# Patient Record
Sex: Female | Born: 1955
Health system: Southern US, Community
[De-identification: ages and names within clinical notes are randomized; demographics above are authoritative.]

## PROBLEM LIST (undated history)

## (undated) DIAGNOSIS — Z860100 Personal history of colon polyps, unspecified: Secondary | ICD-10-CM

## (undated) DIAGNOSIS — Z8489 Family history of other specified conditions: Secondary | ICD-10-CM

## (undated) DIAGNOSIS — E7800 Pure hypercholesterolemia, unspecified: Secondary | ICD-10-CM

## (undated) DIAGNOSIS — I10 Essential (primary) hypertension: Secondary | ICD-10-CM

## (undated) DIAGNOSIS — S43429A Sprain of unspecified rotator cuff capsule, initial encounter: Secondary | ICD-10-CM

## (undated) DIAGNOSIS — M1990 Unspecified osteoarthritis, unspecified site: Secondary | ICD-10-CM

## (undated) DIAGNOSIS — C801 Malignant (primary) neoplasm, unspecified: Secondary | ICD-10-CM

## (undated) DIAGNOSIS — D649 Anemia, unspecified: Secondary | ICD-10-CM

## (undated) DIAGNOSIS — Z95810 Presence of automatic (implantable) cardiac defibrillator: Secondary | ICD-10-CM

## (undated) DIAGNOSIS — I5181 Takotsubo syndrome: Secondary | ICD-10-CM

## (undated) DIAGNOSIS — I447 Left bundle-branch block, unspecified: Secondary | ICD-10-CM

## (undated) DIAGNOSIS — R000 Tachycardia, unspecified: Secondary | ICD-10-CM

## (undated) DIAGNOSIS — I5022 Chronic systolic (congestive) heart failure: Secondary | ICD-10-CM

## (undated) DIAGNOSIS — J4550 Severe persistent asthma, uncomplicated: Secondary | ICD-10-CM

## (undated) HISTORY — PX: CHOLECYSTECTOMY: SHX55

## (undated) HISTORY — DX: Takotsubo syndrome: I51.81

## (undated) HISTORY — PX: ABDOMINAL HYSTERECTOMY: SHX81

## (undated) HISTORY — DX: Tachycardia, unspecified: R00.0

## (undated) HISTORY — DX: Personal history of colonic polyps: Z86.010

## (undated) HISTORY — DX: Chronic systolic (congestive) heart failure: I50.22

## (undated) HISTORY — PX: OOPHORECTOMY: SHX86

## (undated) HISTORY — DX: Severe persistent asthma, uncomplicated: J45.50

## (undated) HISTORY — DX: Left bundle-branch block, unspecified: I44.7

## (undated) HISTORY — DX: Sprain of unspecified rotator cuff capsule, initial encounter: S43.429A

## (undated) HISTORY — PX: COLONOSCOPY: SHX174

## (undated) HISTORY — DX: Personal history of colon polyps, unspecified: Z86.0100

---
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
## 1986-10-26 DIAGNOSIS — C801 Malignant (primary) neoplasm, unspecified: Secondary | ICD-10-CM

## 1986-10-26 HISTORY — DX: Malignant (primary) neoplasm, unspecified: C80.1

## 2006-08-22 ENCOUNTER — Emergency Department: Payer: Self-pay | Admitting: Emergency Medicine

## 2013-07-24 ENCOUNTER — Ambulatory Visit: Payer: Self-pay | Admitting: Internal Medicine

## 2013-10-09 ENCOUNTER — Ambulatory Visit: Payer: Self-pay | Admitting: Gastroenterology

## 2013-10-10 ENCOUNTER — Ambulatory Visit: Payer: Self-pay | Admitting: Gastroenterology

## 2013-10-10 LAB — CBC WITH DIFFERENTIAL/PLATELET
Eosinophil #: 0.4 10*3/uL (ref 0.0–0.7)
HCT: 35.5 % (ref 35.0–47.0)
HGB: 12.6 g/dL (ref 12.0–16.0)
Lymphocyte #: 1.9 10*3/uL (ref 1.0–3.6)
Lymphocyte %: 27.7 %
MCH: 34.2 pg — ABNORMAL HIGH (ref 26.0–34.0)
MCHC: 35.5 g/dL (ref 32.0–36.0)
MCV: 96 fL (ref 80–100)
Monocyte #: 0.4 x10 3/mm (ref 0.2–0.9)
Neutrophil #: 4.1 10*3/uL (ref 1.4–6.5)
Platelet: 155 10*3/uL (ref 150–440)
RBC: 3.69 10*6/uL — ABNORMAL LOW (ref 3.80–5.20)
RDW: 13.4 % (ref 11.5–14.5)
WBC: 6.8 10*3/uL (ref 3.6–11.0)

## 2013-10-10 LAB — BASIC METABOLIC PANEL
Anion Gap: 2 — ABNORMAL LOW (ref 7–16)
BUN: 10 mg/dL (ref 7–18)
Calcium, Total: 10.8 mg/dL — ABNORMAL HIGH (ref 8.5–10.1)
Chloride: 106 mmol/L (ref 98–107)
Creatinine: 0.79 mg/dL (ref 0.60–1.30)
EGFR (African American): >60
EGFR (Non-African Amer.): >60
Glucose: 103 mg/dL — ABNORMAL HIGH (ref 65–99)
Sodium: 137 mmol/L (ref 136–145)

## 2015-02-04 ENCOUNTER — Emergency Department
Admit: 2015-02-04 | Disposition: A | Discharge status: Home / Self Care | Payer: Self-pay | Admitting: Emergency Medicine

## 2015-02-08 DIAGNOSIS — I10 Essential (primary) hypertension: Secondary | ICD-10-CM | POA: Insufficient documentation

## 2015-02-21 ENCOUNTER — Ambulatory Visit
Admit: 2015-02-21 | Disposition: A | Discharge status: Home / Self Care | Payer: Self-pay | Attending: Internal Medicine | Admitting: Internal Medicine

## 2015-03-14 DIAGNOSIS — E7800 Pure hypercholesterolemia, unspecified: Secondary | ICD-10-CM | POA: Insufficient documentation

## 2015-04-01 ENCOUNTER — Other Ambulatory Visit: Payer: Self-pay | Admitting: Orthopedic Surgery

## 2015-04-01 DIAGNOSIS — S43421D Sprain of right rotator cuff capsule, subsequent encounter: Secondary | ICD-10-CM

## 2015-04-01 DIAGNOSIS — S40011D Contusion of right shoulder, subsequent encounter: Secondary | ICD-10-CM

## 2015-04-09 ENCOUNTER — Ambulatory Visit
Admission: RE | Admit: 2015-04-09 | Discharge: 2015-04-09 | Disposition: A | Discharge status: Home / Self Care | Payer: 59 | Source: Ambulatory Visit | Attending: Orthopedic Surgery | Admitting: Orthopedic Surgery

## 2015-04-09 DIAGNOSIS — S43421D Sprain of right rotator cuff capsule, subsequent encounter: Secondary | ICD-10-CM | POA: Diagnosis not present

## 2015-04-09 DIAGNOSIS — S40011D Contusion of right shoulder, subsequent encounter: Secondary | ICD-10-CM | POA: Diagnosis not present

## 2015-05-14 DIAGNOSIS — S43421A Sprain of right rotator cuff capsule, initial encounter: Secondary | ICD-10-CM | POA: Insufficient documentation

## 2015-06-11 DIAGNOSIS — M75121 Complete rotator cuff tear or rupture of right shoulder, not specified as traumatic: Secondary | ICD-10-CM | POA: Insufficient documentation

## 2015-06-20 ENCOUNTER — Encounter: Payer: Self-pay | Admitting: *Deleted

## 2015-06-21 ENCOUNTER — Encounter: Payer: Self-pay | Admitting: Anesthesiology

## 2015-06-28 ENCOUNTER — Ambulatory Visit
Admission: RE | Admit: 2015-06-28 | Discharge: 2015-06-28 | Disposition: A | Discharge status: Home / Self Care | Payer: 59 | Source: Ambulatory Visit | Attending: Unknown Physician Specialty | Admitting: Unknown Physician Specialty

## 2015-06-28 ENCOUNTER — Ambulatory Visit: Payer: 59 | Admitting: Anesthesiology

## 2015-06-28 ENCOUNTER — Encounter
Admission: RE | Disposition: A | Discharge status: Home / Self Care | Payer: Self-pay | Source: Ambulatory Visit | Attending: Unknown Physician Specialty

## 2015-06-28 ENCOUNTER — Encounter: Payer: Self-pay | Admitting: *Deleted

## 2015-06-28 DIAGNOSIS — Z833 Family history of diabetes mellitus: Secondary | ICD-10-CM | POA: Insufficient documentation

## 2015-06-28 DIAGNOSIS — E785 Hyperlipidemia, unspecified: Secondary | ICD-10-CM | POA: Insufficient documentation

## 2015-06-28 DIAGNOSIS — Z8249 Family history of ischemic heart disease and other diseases of the circulatory system: Secondary | ICD-10-CM | POA: Diagnosis not present

## 2015-06-28 DIAGNOSIS — M1990 Unspecified osteoarthritis, unspecified site: Secondary | ICD-10-CM | POA: Insufficient documentation

## 2015-06-28 DIAGNOSIS — Z9049 Acquired absence of other specified parts of digestive tract: Secondary | ICD-10-CM | POA: Insufficient documentation

## 2015-06-28 DIAGNOSIS — I10 Essential (primary) hypertension: Secondary | ICD-10-CM | POA: Diagnosis not present

## 2015-06-28 DIAGNOSIS — M75121 Complete rotator cuff tear or rupture of right shoulder, not specified as traumatic: Secondary | ICD-10-CM | POA: Diagnosis not present

## 2015-06-28 DIAGNOSIS — F17210 Nicotine dependence, cigarettes, uncomplicated: Secondary | ICD-10-CM | POA: Diagnosis not present

## 2015-06-28 DIAGNOSIS — M75101 Unspecified rotator cuff tear or rupture of right shoulder, not specified as traumatic: Secondary | ICD-10-CM | POA: Diagnosis present

## 2015-06-28 DIAGNOSIS — Z86010 Personal history of colonic polyps: Secondary | ICD-10-CM | POA: Insufficient documentation

## 2015-06-28 HISTORY — PX: SHOULDER ARTHROSCOPY WITH OPEN ROTATOR CUFF REPAIR: SHX6092

## 2015-06-28 HISTORY — DX: Pure hypercholesterolemia, unspecified: E78.00

## 2015-06-28 HISTORY — DX: Unspecified osteoarthritis, unspecified site: M19.90

## 2015-06-28 HISTORY — DX: Essential (primary) hypertension: I10

## 2015-06-28 SURGERY — ARTHROSCOPY, SHOULDER WITH REPAIR, ROTATOR CUFF, OPEN
Anesthesia: General | Laterality: Right | Wound class: Clean

## 2015-06-28 MED ORDER — CEFAZOLIN SODIUM-DEXTROSE 2-3 GM-% IV SOLR
INTRAVENOUS | Status: DC | PRN
  Administered 2015-06-28: 2 g via INTRAVENOUS

## 2015-06-28 MED ORDER — ROPIVACAINE HCL 5 MG/ML IJ SOLN
INTRAMUSCULAR | Status: DC | PRN
  Administered 2015-06-28: 30 mL via EPIDURAL

## 2015-06-28 MED ORDER — FENTANYL CITRATE (PF) 100 MCG/2ML IJ SOLN
50.0000 ug | Freq: Once | INTRAMUSCULAR | Status: AC
Start: 2015-06-28 — End: 2015-06-28
  Administered 2015-06-28: 50 ug via INTRAVENOUS

## 2015-06-28 MED ORDER — PROMETHAZINE HCL 25 MG/ML IJ SOLN: 6.2500 mg | INTRAMUSCULAR | Status: DC | PRN

## 2015-06-28 MED ORDER — ONDANSETRON HCL 4 MG/2ML IJ SOLN
INTRAMUSCULAR | Status: DC | PRN
  Administered 2015-06-28: 4 mg via INTRAVENOUS

## 2015-06-28 MED ORDER — LACTATED RINGERS IV SOLN
INTRAVENOUS | Status: DC
  Administered 2015-06-28: 11:00:00 via INTRAVENOUS

## 2015-06-28 MED ORDER — FENTANYL CITRATE (PF) 100 MCG/2ML IJ SOLN: 25.0000 ug | INTRAMUSCULAR | Status: DC | PRN

## 2015-06-28 MED ORDER — PROPOFOL 10 MG/ML IV BOLUS
INTRAVENOUS | Status: DC | PRN
  Administered 2015-06-28: 150 mg via INTRAVENOUS

## 2015-06-28 MED ORDER — LACTATED RINGERS IV SOLN
INTRAVENOUS | Status: DC | PRN
  Administered 2015-06-28: 8000 mL

## 2015-06-28 MED ORDER — MIDAZOLAM HCL 2 MG/2ML IJ SOLN
1.0000 mg | Freq: Once | INTRAMUSCULAR | Status: AC
  Administered 2015-06-28: 1 mg via INTRAVENOUS

## 2015-06-28 MED ORDER — LIDOCAINE HCL (CARDIAC) 20 MG/ML IV SOLN
INTRAVENOUS | Status: DC | PRN
  Administered 2015-06-28: 40 mg via INTRATRACHEAL

## 2015-06-28 MED ORDER — DEXAMETHASONE SODIUM PHOSPHATE 4 MG/ML IJ SOLN
INTRAMUSCULAR | Status: DC | PRN
Start: 2015-06-28 — End: 2015-06-28
  Administered 2015-06-28: 8 mg via INTRAVENOUS

## 2015-06-28 SURGICAL SUPPLY — 82 items
ADAPTER IRRIG TUBE 2 SPIKE SOL (ADAPTER) ×3 IMPLANT
ANCHOR ALL-SUT Q-FIX 2.8 (Anchor) ×6 IMPLANT
ANCHOR SUT 5.5 SPEEDSCREW (Screw) ×6 IMPLANT
ARTHROWAND PARAGON T2 (SURGICAL WAND)
BLADE SHAVER 4.5X7 STR FR (MISCELLANEOUS) ×3 IMPLANT
BLADE SURG 15 STRL LF DISP TIS (BLADE) IMPLANT
BLADE SURG 15 STRL SS (BLADE)
BUR ABRADER 5.5 BLK (MISCELLANEOUS) ×1 IMPLANT
BUR BR 5.5 12 FLUTE (BURR) IMPLANT
BUR BR 5.5 WIDE MOUTH (BURR) ×3 IMPLANT
BUR HOODED 3.0 ABRASION (BURR) IMPLANT
BUR RADIUS 4.0X18.5 (BURR) IMPLANT
BUR RADIUS 5.5 (BURR) IMPLANT
BURR ABRADER 5.5 BLK (MISCELLANEOUS) ×3
CANN 8.5MMX72MM THREADED (CANNULA) ×1
CANNULA 8.5X75 THRED (CANNULA) IMPLANT
CANNULA SHAVER 8MMX76MM (CANNULA) IMPLANT
CANNULA THRD 8.5X72 DISP (CANNULA) ×2 IMPLANT
CAP LOCK ULTRA CANNULA (MISCELLANEOUS) IMPLANT
COVER LIGHT HANDLE FLEXIBLE (MISCELLANEOUS) ×3 IMPLANT
CUTTER CANN W/HOLE 4.5 (CUTTER) IMPLANT
CUTTER SLOTTED WHISKER 4.0 (BURR) IMPLANT
DRAPE STERI 35X30 U-POUCH (DRAPES) ×3 IMPLANT
DURAPREP 26ML APPLICATOR (WOUND CARE) ×3 IMPLANT
GAUZE SPONGE 4X4 12PLY STRL (GAUZE/BANDAGES/DRESSINGS) ×3 IMPLANT
GLOVE BIO SURGEON STRL SZ7.5 (GLOVE) ×3 IMPLANT
GLOVE BIO SURGEON STRL SZ8 (GLOVE) ×3 IMPLANT
GLOVE INDICATOR 8.0 STRL GRN (GLOVE) ×3 IMPLANT
GOWN STRL REIN 2XL LVL4 (GOWN DISPOSABLE) ×3 IMPLANT
GOWN STRL REUS W/ TWL LRG LVL3 (GOWN DISPOSABLE) ×1 IMPLANT
GOWN STRL REUS W/TWL LRG LVL3 (GOWN DISPOSABLE) ×2
IV LACTATED RINGER IRRG 3000ML (IV SOLUTION) ×2
IV LR IRRIG 3000ML ARTHROMATIC (IV SOLUTION) ×1 IMPLANT
KIT SHOULDER TRACTION (DRAPES) ×3 IMPLANT
KIT SUTURE 2.8 Q-FIX DISP (MISCELLANEOUS) ×3 IMPLANT
MANIFOLD 4PT FOR NEPTUNE1 (MISCELLANEOUS) ×3 IMPLANT
NEEDLE 18GX1X1/2 (RX/OR ONLY) (NEEDLE) IMPLANT
NEEDLE MAYO CATGUT SZ 1.5 (NEEDLE)
NEEDLE MAYO CATGUT SZ 2 (NEEDLE) IMPLANT
NEEDLE SPNL 18GX3.5 QUINCKE PK (NEEDLE) IMPLANT
PACK ARTHROSCOPY SHOULDER (MISCELLANEOUS) ×3 IMPLANT
PAD GROUND ADULT SPLIT (MISCELLANEOUS) ×3 IMPLANT
PASSER SUT CAPTURE FIRST (SUTURE) ×3 IMPLANT
SET TUBE SUCT SHAVER OUTFL 24K (TUBING) ×3 IMPLANT
SLING ULTRA II M (MISCELLANEOUS) ×3 IMPLANT
SOL PREP PVP 2OZ (MISCELLANEOUS) ×3
SOLUTION PREP PVP 2OZ (MISCELLANEOUS) ×1 IMPLANT
STAPLER SKIN PROX 35W (STAPLE) IMPLANT
SUT ETHIBOND NAB CT1 #1 30IN (SUTURE) ×3 IMPLANT
SUT ETHILON 3-0 FS-10 30 BLK (SUTURE) ×3
SUT MAGNUM WIRE 2 (SUTURE) IMPLANT
SUT PDS AB 1 CT1 27 (SUTURE) IMPLANT
SUT PDSII 0 (SUTURE) IMPLANT
SUT PERFECTPASSER WHITE CART (SUTURE) IMPLANT
SUT PROLENE 2 0 CT2 30 (SUTURE) IMPLANT
SUT SMART STITCH CARTRIDGE (SUTURE) IMPLANT
SUT TICRON 2-0 30IN 311381 (SUTURE) IMPLANT
SUT VIC AB 0 CT1 36 (SUTURE) ×3 IMPLANT
SUT VIC AB 0 CT2 27 (SUTURE) IMPLANT
SUT VIC AB 2-0 CT1 27 (SUTURE)
SUT VIC AB 2-0 CT1 TAPERPNT 27 (SUTURE) IMPLANT
SUT VIC AB 2-0 CT2 27 (SUTURE) IMPLANT
SUT VIC AB 2-0 SH 27 (SUTURE) ×2
SUT VIC AB 2-0 SH 27XBRD (SUTURE) ×1 IMPLANT
SUT VIC AB 3-0 SH 27 (SUTURE) ×2
SUT VIC AB 3-0 SH 27X BRD (SUTURE) ×1 IMPLANT
SUTURE EHLN 3-0 FS-10 30 BLK (SUTURE) ×1 IMPLANT
SUTURE MAGNUM WIRE 2X48 BLK (SUTURE) IMPLANT
SYRINGE 10CC LL (SYRINGE) IMPLANT
TAPE MICROFOAM 4IN (TAPE) ×3 IMPLANT
TUBING ARTHRO INFLOW-ONLY STRL (TUBING) ×3 IMPLANT
WAND 30 DEG SABER W/CORD (SURGICAL WAND) IMPLANT
WAND ARTHRO PARAGON T2 (SURGICAL WAND) IMPLANT
WAND COVAC 50 IFS (MISCELLANEOUS) IMPLANT
WAND COVATOR 20 (MISCELLANEOUS) IMPLANT
WAND HAND CNTRL MULTIVAC 50 (MISCELLANEOUS) IMPLANT
WAND HAND CNTRL MULTIVAC 90 (MISCELLANEOUS) ×3 IMPLANT
WAND MEGAVAC 90 (MISCELLANEOUS) IMPLANT
WAND TENDON TOPAZ 0 ANGL (MISCELLANEOUS) IMPLANT
WAND TOPAZ EPF  WAS Q (MISCELLANEOUS)
WAND TOPAZ EPF WAS Q (MISCELLANEOUS) IMPLANT
WIRE MAGNUM (SUTURE) IMPLANT

## 2015-06-28 NOTE — Progress Notes (Signed)
Assisted Waldon Merl ANMD with right, ultrasound guided, interscalene  block. Side rails up, monitors on throughout procedure. See vital signs in flow sheet. Tolerated Procedure well.

## 2015-06-28 NOTE — Discharge Instructions (Signed)
General Anesthesia, Care After Refer to this sheet in the next few weeks. These instructions provide you with information on caring for yourself after your procedure. Your health care provider may also give you more specific instructions. Your treatment has been planned according to current medical practices, but problems sometimes occur. Call your health care provider if you have any problems or questions after your procedure. WHAT TO EXPECT AFTER THE PROCEDURE After the procedure, it is typical to experience:  Sleepiness.  Nausea and vomiting. HOME CARE INSTRUCTIONS  For the first 24 hours after general anesthesia:  Have a responsible person with you.  Do not drive a car. If you are alone, do not take public transportation.  Do not drink alcohol.  Do not take medicine that has not been prescribed by your health care provider.  Do not sign important papers or make important decisions.  You may resume a normal diet and activities as directed by your health care provider.  Change bandages (dressings) as directed.  If you have questions or problems that seem related to general anesthesia, call the hospital and ask for the anesthetist or anesthesiologist on call. SEEK MEDICAL CARE IF:  You have nausea and vomiting that continue the day after anesthesia.  You develop a rash. SEEK IMMEDIATE MEDICAL CARE IF:   You have difficulty breathing.  You have chest pain.  You have any allergic problems. Document Released: 01/18/2001 Document Revised: 10/17/2013 Document Reviewed: 04/27/2013 [facility] Patient Information 2015 [address], [place]. This information is not intended to replace advice given to you by your health care provider. Make sure you discuss any questions you have with your health care provider. Use shoulder immobilizer at all times  Keep dressing dry  Leave dressing in place until first postoperative visit  Return to the clinic about 1 week post surgery  Take 81 mg  aspirin or Bufferin tablet twice a day for 2 weeks post surgery  Can sleep with multiple pillows behind the back or in a recliner  Use TENS unit if prescribed  Take pain medication prior to going to sleep the evening of your surgery  Ice pack prn  Activity as tolerated

## 2015-06-28 NOTE — Anesthesia Preprocedure Evaluation (Signed)
Anesthesia Evaluation  Patient identified by MRN, date of birth, ID band Patient awake    Reviewed: Allergy & Precautions, NPO status , Patient's Chart, lab work & pertinent test results  Airway Mallampati: II  TM Distance: >3 FB Neck ROM: Full    Dental no notable dental hx.    Pulmonary neg pulmonary ROS, Current Smoker,  breath sounds clear to auscultation  Pulmonary exam normal       Cardiovascular hypertension, negative cardio ROS Normal cardiovascular examRhythm:Regular Rate:Normal     Neuro/Psych negative neurological ROS  negative psych ROS   GI/Hepatic negative GI ROS, Neg liver ROS,   Endo/Other  negative endocrine ROS  Renal/GU negative Renal ROS  negative genitourinary   Musculoskeletal negative musculoskeletal ROS (+)   Abdominal   Peds negative pediatric ROS (+)  Hematology negative hematology ROS (+)   Anesthesia Other Findings   Reproductive/Obstetrics negative OB ROS                             Anesthesia Physical Anesthesia Plan  ASA: II  Anesthesia Plan: General   Post-op Pain Management:    Induction: Intravenous  Airway Management Planned:   Additional Equipment:   Intra-op Plan:   Post-operative Plan: Extubation in OR  Informed Consent: I have reviewed the patients History and Physical, chart, labs and discussed the procedure including the risks, benefits and alternatives for the proposed anesthesia with the patient or authorized representative who has indicated his/her understanding and acceptance.   Dental advisory given  Plan Discussed with: CRNA  Anesthesia Plan Comments:         Anesthesia Quick Evaluation

## 2015-06-28 NOTE — Anesthesia Postprocedure Evaluation (Signed)
  Anesthesia Post-op Note  Patient: Raven Lambert  Procedure(s) Performed: Procedure(s): SHOULDER ARTHROSCOPY WITH  OPEN ROTATOR CUFF REPAIR, Biceps tendon release and subacromial decompression. (Right)  Anesthesia type:General  Patient location: PACU  Post pain: Pain level controlled  Post assessment: Post-op Vital signs reviewed, Patient's Cardiovascular Status Stable, Respiratory Function Stable, Patent Airway and No signs of Nausea or vomiting  Post vital signs: Reviewed and stable  Last Vitals:  Filed Vitals:   06/28/15 1415  BP: 120/60  Pulse: 98  Temp:   Resp: 22    Level of consciousness: awake, alert  and patient cooperative  Complications: No apparent anesthesia complications

## 2015-06-28 NOTE — Anesthesia Procedure Notes (Addendum)
Anesthesia Regional Block:  Interscalene brachial plexus block  Pre-Anesthetic Checklist: ,, timeout performed, Correct Patient, Correct Site, Correct Laterality, Correct Procedure, Correct Position, site marked, Risks and benefits discussed,  Surgical consent,  Pre-op evaluation,  At surgeon's request and post-op pain management  Laterality: Right  Prep: chloraprep       Needles:  Injection technique: Single-shot  Needle Type: Stimiplex     Needle Length: 10cm 10 cm Needle Gauge: 21 and 21 G  Needle insertion depth: 3 cm   Additional Needles:  Procedures: ultrasound guided (picture in chart) and nerve stimulator Interscalene brachial plexus block  Nerve Stimulator or Paresthesia:  Response: 0.5 mA,   Additional Responses:   Narrative:  Start time: 06/28/2015 10:50 AM End time: 06/28/2015 10:57 AM Injection made incrementally with aspirations every 5 mL.  Performed by: Personally  Anesthesiologist: Waldon Merl C  Additional Notes: Functioning IV was confirmed and monitors applied. Ultrasound guidance: relevant anatomy identified, needle position confirmed, local anesthetic spread visualized around nerve(s)., vascular puncture avoided.  Image printed for medical record.  Negative aspiration and no paresthesias; incremental administration of local anesthetic. The patient tolerated the procedure well. Vitals signes recorded in RN notes.  Excellent stim and image.  Needle, local, nerves and blood vessels all well visualized throughout procedure.  No complications.  No needle contact with nerves.    Procedure Name: LMA Insertion Date/Time: 06/28/2015 12:01 PM Performed by: Londell Moh Pre-anesthesia Checklist: Patient identified, Patient being monitored, Timeout performed, Emergency Drugs available and Suction available Patient Re-evaluated:Patient Re-evaluated prior to inductionOxygen Delivery Method: Circle system utilized Preoxygenation: Pre-oxygenation with 100%  oxygen Intubation Type: IV induction Ventilation: Mask ventilation without difficulty LMA: LMA inserted LMA Size: 4.0 Tube type: Oral Number of attempts: 1 Placement Confirmation: positive ETCO2 and breath sounds checked- equal and bilateral Tube secured with: Tape Dental Injury: Teeth and Oropharynx as per pre-operative assessment

## 2015-06-28 NOTE — Op Note (Signed)
06/28/2015  2:07 PM  Patient:   Raven Lambert  Pre-Op Diagnosis:   COMPLETE TEAR OF RIGHT ROTATOR CUFF M75.121  Postoperative diagnosis: Rotator cuff tear right shoulder along with bicipital tendinosis and impingement   Procedure: Arthroscopic subacromial decompression along with arthroscopic release of the long head of the biceps tendon plus mini incision rotator cuff repair  Anesthesia: General endotracheal with interscalene block placed preoperatively by the anesthesiologist.  Findings: As above.   Complications: None  Estimated blood loss: negligible  Tourniquet time: None  Drains: None   Brief clinical note:  The patient's symptoms have progressed despite medications, activity modification, etc. The patient's history and examination are consistent with rotator cuff tear and impingement. These findings were confirmed by MRI scan. The patient presents at this time for definitive management of these shoulder symptoms.  Procedure: The patient had an interscalene block on the right shoulder in the preop area and then was brought into the operating room and placed in the supine position. The patient then underwent  endotracheal intubation and general anesthesia before being repositioned in the lateral decubitus position. The right shoulder was prepped and draped in usual fashion for shoulder procedure. The shoulder was supported with the Acufex shoulder suspension device.  10 pounds of traction was utilized. Preoperative antibiotics were administered. A timeout was performed . A posterior portal was created and the glenohumeral joint thoroughly inspected revealing a frayed long head of the biceps tendon. An anterior portal was created. An ArthroCare wand was inserted and used to obtain hemostasis as well as to perform a limited synovectomy.The biceps tendon was evaluated and then released from its labral attachment using an ArthroCare wand.  The scope was  repositioned through the posterior portal into the subacromial space. A separate lateral portal was created using an outside-in technique. A small to moderate-sized complete rotator cuff tear was visualized. It was only slightly retracted. An ArthroCare 90 wand followed by a 4.0 full-radius resector was introduced and used to perform a subtotal bursectomy. The ArthroCare wand was then inserted and used to remove the periosteal tissue off the undersurface of the anterior third of the acromion as well as to recess the coracoacromial ligament from its attachment along the anterior and lateral margins of the acromion.   With the scope in the lateral portal a 5.62mm acromionizing bur was introduced through the posterior portal and used to perform the decompression by removing the undersurface of the anterior third of the acromion. I also used a 5.5 mm round bur to lightly decorticate the greater tuberosity in the intended area of the rotator rotator cuff reattachment. I also used a small punch to make vascular vent holes in the greater tuberosity. The instruments were then removed from the subacromial space.  An approximately 4 cm incision was made over the midlateral aspect of the shoulder.   This incision was carried down through the subcutaneous tissues onto the deltoid. The deltoid was divided in line with its fibers to provide access into the subacromial space. The rotator cuff tear was readily identified. The margins were debrided.   The tear was repaired using horizontal mattress sutures secured to two Q- Fix anchors. The horizontal mattress suture tails were then crisscrossed over to 2 laterally placed 5.5 speed screws. This gave me a 2 row repair of the torn rotator cuff. The repaired cuff seemed to be  quite stable.   The wound was copiously irrigated with sterile saline solution before the deltoid was repaired to bone with #  1 ethibond suture.  Deltoid interval was closed with 0 vicryl. The subcutaneous  tissues were closed  using 2-0 Vicryl interrupted sutures and the skin incision was closed using staples. The portal sites also were closed using 3-O nylon sutures. A sterile bulky dressing was applied to the shoulder and then the arm was placed into a shoulder immobilizer. The patient was then awakened, extubated, and returned to the recovery room in satisfactory condition after tolerating the procedure well.  Blood loss was negligible.

## 2015-06-28 NOTE — H&P (Signed)
  H and P reviewed. No changes. Uploaded at later date. 

## 2015-06-28 NOTE — Transfer of Care (Signed)
Immediate Anesthesia Transfer of Care Note  Patient: Raven Lambert  Procedure(s) Performed: Procedure(s): SHOULDER ARTHROSCOPY WITH  OPEN ROTATOR CUFF REPAIR, Biceps tendon release and subacromial decompression. (Right)  Patient Location: PACU  Anesthesia Type: General  Level of Consciousness: awake, alert  and patient cooperative  Airway and Oxygen Therapy: Patient Spontanous Breathing and Patient connected to supplemental oxygen  Post-op Assessment: Post-op Vital signs reviewed, Patient's Cardiovascular Status Stable, Respiratory Function Stable, Patent Airway and No signs of Nausea or vomiting  Post-op Vital Signs: Reviewed and stable  Complications: No apparent anesthesia complications

## 2015-07-02 ENCOUNTER — Encounter: Payer: Self-pay | Admitting: Unknown Physician Specialty

## 2016-02-17 ENCOUNTER — Other Ambulatory Visit: Payer: Self-pay | Admitting: Internal Medicine

## 2016-02-17 DIAGNOSIS — Z1239 Encounter for other screening for malignant neoplasm of breast: Secondary | ICD-10-CM

## 2016-02-28 ENCOUNTER — Ambulatory Visit
Admission: RE | Admit: 2016-02-28 | Discharge: 2016-02-28 | Disposition: A | Discharge status: Home / Self Care | Payer: 59 | Source: Ambulatory Visit | Attending: Internal Medicine | Admitting: Internal Medicine

## 2016-02-28 DIAGNOSIS — Z1231 Encounter for screening mammogram for malignant neoplasm of breast: Secondary | ICD-10-CM | POA: Diagnosis present

## 2016-02-28 DIAGNOSIS — Z1239 Encounter for other screening for malignant neoplasm of breast: Secondary | ICD-10-CM

## 2016-02-28 HISTORY — DX: Malignant (primary) neoplasm, unspecified: C80.1

## 2017-04-05 ENCOUNTER — Emergency Department (HOSPITAL_COMMUNITY): Payer: 59

## 2017-04-05 ENCOUNTER — Encounter (HOSPITAL_COMMUNITY): Payer: Self-pay

## 2017-04-05 ENCOUNTER — Inpatient Hospital Stay (HOSPITAL_COMMUNITY): Payer: 59

## 2017-04-05 ENCOUNTER — Inpatient Hospital Stay (HOSPITAL_COMMUNITY)
Admission: EM | Admit: 2017-04-05 | Discharge: 2017-04-07 | DRG: 084 | Disposition: A | Discharge status: Home / Self Care | Payer: 59 | Attending: General Surgery | Admitting: General Surgery

## 2017-04-05 DIAGNOSIS — Z90710 Acquired absence of both cervix and uterus: Secondary | ICD-10-CM | POA: Diagnosis not present

## 2017-04-05 DIAGNOSIS — S7001XA Contusion of right hip, initial encounter: Secondary | ICD-10-CM | POA: Diagnosis present

## 2017-04-05 DIAGNOSIS — M25551 Pain in right hip: Secondary | ICD-10-CM

## 2017-04-05 DIAGNOSIS — E7800 Pure hypercholesterolemia, unspecified: Secondary | ICD-10-CM | POA: Diagnosis present

## 2017-04-05 DIAGNOSIS — S066X9A Traumatic subarachnoid hemorrhage with loss of consciousness of unspecified duration, initial encounter: Secondary | ICD-10-CM | POA: Diagnosis not present

## 2017-04-05 DIAGNOSIS — R51 Headache: Secondary | ICD-10-CM | POA: Diagnosis not present

## 2017-04-05 DIAGNOSIS — S062X1A Diffuse traumatic brain injury with loss of consciousness of 30 minutes or less, initial encounter: Secondary | ICD-10-CM | POA: Diagnosis not present

## 2017-04-05 DIAGNOSIS — S06341A Traumatic hemorrhage of right cerebrum with loss of consciousness of 30 minutes or less, initial encounter: Secondary | ICD-10-CM

## 2017-04-05 DIAGNOSIS — F17210 Nicotine dependence, cigarettes, uncomplicated: Secondary | ICD-10-CM | POA: Diagnosis present

## 2017-04-05 DIAGNOSIS — S062X9A Diffuse traumatic brain injury with loss of consciousness of unspecified duration, initial encounter: Secondary | ICD-10-CM | POA: Diagnosis present

## 2017-04-05 DIAGNOSIS — S06339A Contusion and laceration of cerebrum, unspecified, with loss of consciousness of unspecified duration, initial encounter: Secondary | ICD-10-CM | POA: Diagnosis not present

## 2017-04-05 DIAGNOSIS — Y92410 Unspecified street and highway as the place of occurrence of the external cause: Secondary | ICD-10-CM

## 2017-04-05 DIAGNOSIS — I10 Essential (primary) hypertension: Secondary | ICD-10-CM | POA: Diagnosis present

## 2017-04-05 DIAGNOSIS — I609 Nontraumatic subarachnoid hemorrhage, unspecified: Secondary | ICD-10-CM

## 2017-04-05 DIAGNOSIS — Z8249 Family history of ischemic heart disease and other diseases of the circulatory system: Secondary | ICD-10-CM

## 2017-04-05 DIAGNOSIS — S060X1A Concussion with loss of consciousness of 30 minutes or less, initial encounter: Secondary | ICD-10-CM | POA: Diagnosis not present

## 2017-04-05 DIAGNOSIS — Z833 Family history of diabetes mellitus: Secondary | ICD-10-CM

## 2017-04-05 DIAGNOSIS — S161XXA Strain of muscle, fascia and tendon at neck level, initial encounter: Secondary | ICD-10-CM | POA: Diagnosis present

## 2017-04-05 DIAGNOSIS — R079 Chest pain, unspecified: Secondary | ICD-10-CM | POA: Diagnosis not present

## 2017-04-05 DIAGNOSIS — S139XXA Sprain of joints and ligaments of unspecified parts of neck, initial encounter: Secondary | ICD-10-CM

## 2017-04-05 DIAGNOSIS — M542 Cervicalgia: Secondary | ICD-10-CM | POA: Diagnosis not present

## 2017-04-05 DIAGNOSIS — S79911A Unspecified injury of right hip, initial encounter: Secondary | ICD-10-CM | POA: Diagnosis not present

## 2017-04-05 DIAGNOSIS — S299XXA Unspecified injury of thorax, initial encounter: Secondary | ICD-10-CM | POA: Diagnosis not present

## 2017-04-05 DIAGNOSIS — S0990XA Unspecified injury of head, initial encounter: Secondary | ICD-10-CM | POA: Diagnosis not present

## 2017-04-05 DIAGNOSIS — N200 Calculus of kidney: Secondary | ICD-10-CM | POA: Diagnosis not present

## 2017-04-05 LAB — COMPREHENSIVE METABOLIC PANEL
ALBUMIN: 3.9 g/dL (ref 3.5–5.0)
ALT: 22 U/L (ref 14–54)
AST: 26 U/L (ref 15–41)
Alkaline Phosphatase: 88 U/L (ref 38–126)
Anion gap: 8 (ref 5–15)
BUN: 10 mg/dL (ref 6–20)
CO2: 23 mmol/L (ref 22–32)
CREATININE: 0.7 mg/dL (ref 0.44–1.00)
Calcium: 9.5 mg/dL (ref 8.9–10.3)
Chloride: 107 mmol/L (ref 101–111)
GFR calc Af Amer: >60 mL/min (ref >60)
GFR calc non Af Amer: >60 mL/min (ref >60)
GLUCOSE: 115 mg/dL — AB (ref 65–99)
Potassium: 3.7 mmol/L (ref 3.5–5.1)
SODIUM: 138 mmol/L (ref 135–145)
Total Bilirubin: 0.7 mg/dL (ref 0.3–1.2)
Total Protein: 6.6 g/dL (ref 6.5–8.1)

## 2017-04-05 LAB — URINALYSIS, ROUTINE W REFLEX MICROSCOPIC
BILIRUBIN URINE: NEGATIVE
Glucose, UA: NEGATIVE mg/dL
Hgb urine dipstick: NEGATIVE
Ketones, ur: NEGATIVE mg/dL
Nitrite: NEGATIVE
PROTEIN: NEGATIVE mg/dL
SPECIFIC GRAVITY, URINE: 1.005 (ref 1.005–1.030)
pH: 6 (ref 5.0–8.0)

## 2017-04-05 LAB — CBC WITH DIFFERENTIAL/PLATELET
Basophils Absolute: 0 10*3/uL (ref 0.0–0.1)
Basophils Relative: 0 %
EOS ABS: 0.2 10*3/uL (ref 0.0–0.7)
EOS PCT: 3 %
HCT: 36.3 % (ref 36.0–46.0)
Hemoglobin: 12.5 g/dL (ref 12.0–15.0)
LYMPHS ABS: 1.3 10*3/uL (ref 0.7–4.0)
Lymphocytes Relative: 14 %
MCH: 32.5 pg (ref 26.0–34.0)
MCHC: 34.4 g/dL (ref 30.0–36.0)
MCV: 94.3 fL (ref 78.0–100.0)
MONOS PCT: 5 %
Monocytes Absolute: 0.4 10*3/uL (ref 0.1–1.0)
Neutro Abs: 6.8 10*3/uL (ref 1.7–7.7)
Neutrophils Relative %: 78 %
PLATELETS: 132 10*3/uL — AB (ref 150–400)
RBC: 3.85 MIL/uL — ABNORMAL LOW (ref 3.87–5.11)
RDW: 13.9 % (ref 11.5–15.5)
WBC: 8.7 10*3/uL (ref 4.0–10.5)

## 2017-04-05 LAB — PROTIME-INR
INR: 1.01
Prothrombin Time: 13.3 seconds (ref 11.4–15.2)

## 2017-04-05 MED ORDER — ONDANSETRON HCL 4 MG PO TABS
4.0000 mg | ORAL_TABLET | Freq: Four times a day (QID) | ORAL | Status: DC | PRN
  Filled 2017-04-05: qty 1

## 2017-04-05 MED ORDER — SODIUM CHLORIDE 0.9% FLUSH
3.0000 mL | Freq: Two times a day (BID) | INTRAVENOUS | Status: DC
  Administered 2017-04-05 – 2017-04-06 (×3): 3 mL via INTRAVENOUS

## 2017-04-05 MED ORDER — SODIUM CHLORIDE 0.9 % IV SOLN: 250.0000 mL | INTRAVENOUS | Status: DC | PRN

## 2017-04-05 MED ORDER — SODIUM CHLORIDE 0.9 % IV SOLN
Freq: Once | INTRAVENOUS | Status: AC
  Administered 2017-04-05: 11:00:00 via INTRAVENOUS

## 2017-04-05 MED ORDER — ONDANSETRON HCL 4 MG/2ML IJ SOLN
4.0000 mg | Freq: Four times a day (QID) | INTRAMUSCULAR | Status: DC | PRN
  Administered 2017-04-05: 4 mg via INTRAVENOUS

## 2017-04-05 MED ORDER — ONDANSETRON HCL 4 MG/2ML IJ SOLN
4.0000 mg | Freq: Four times a day (QID) | INTRAMUSCULAR | Status: DC | PRN
  Administered 2017-04-05: 4 mg via INTRAVENOUS
  Filled 2017-04-05: qty 2

## 2017-04-05 MED ORDER — ACETAMINOPHEN 325 MG PO TABS: 650.0000 mg | ORAL_TABLET | Freq: Four times a day (QID) | ORAL | Status: DC | PRN

## 2017-04-05 MED ORDER — PNEUMOCOCCAL VAC POLYVALENT 25 MCG/0.5ML IJ INJ
0.5000 mL | INJECTION | INTRAMUSCULAR | Status: AC
  Administered 2017-04-07: 0.5 mL via INTRAMUSCULAR
  Filled 2017-04-05: qty 0.5

## 2017-04-05 MED ORDER — PANTOPRAZOLE SODIUM 40 MG PO TBEC
40.0000 mg | DELAYED_RELEASE_TABLET | Freq: Every day | ORAL | Status: DC
  Administered 2017-04-05 – 2017-04-07 (×3): 40 mg via ORAL
  Filled 2017-04-05 (×3): qty 1

## 2017-04-05 MED ORDER — MORPHINE SULFATE (PF) 4 MG/ML IV SOLN
4.0000 mg | Freq: Once | INTRAVENOUS | Status: AC
  Administered 2017-04-05: 4 mg via INTRAVENOUS
  Filled 2017-04-05: qty 1

## 2017-04-05 MED ORDER — LEVETIRACETAM 500 MG PO TABS
500.0000 mg | ORAL_TABLET | Freq: Two times a day (BID) | ORAL | Status: DC
  Administered 2017-04-05 – 2017-04-07 (×5): 500 mg via ORAL
  Filled 2017-04-05 (×6): qty 1

## 2017-04-05 MED ORDER — SODIUM CHLORIDE 0.9% FLUSH: 3.0000 mL | INTRAVENOUS | Status: DC | PRN

## 2017-04-05 MED ORDER — TRAMADOL HCL 50 MG PO TABS
50.0000 mg | ORAL_TABLET | Freq: Four times a day (QID) | ORAL | Status: DC | PRN
  Administered 2017-04-05: 50 mg via ORAL
  Filled 2017-04-05: qty 1

## 2017-04-05 MED ORDER — IBUPROFEN 200 MG PO TABS
600.0000 mg | ORAL_TABLET | ORAL | Status: DC | PRN
  Administered 2017-04-05 – 2017-04-07 (×5): 600 mg via ORAL
  Filled 2017-04-05 (×5): qty 3

## 2017-04-05 MED ORDER — SODIUM CHLORIDE 0.9 % IV BOLUS (SEPSIS)
500.0000 mL | Freq: Once | INTRAVENOUS | Status: AC
  Administered 2017-04-05: 500 mL via INTRAVENOUS

## 2017-04-05 MED ORDER — PANTOPRAZOLE SODIUM 40 MG IV SOLR: 40.0000 mg | Freq: Every day | INTRAVENOUS | Status: DC

## 2017-04-05 MED ORDER — GADOBENATE DIMEGLUMINE 529 MG/ML IV SOLN
15.0000 mL | Freq: Once | INTRAVENOUS | Status: AC
  Administered 2017-04-05: 15 mL via INTRAVENOUS

## 2017-04-05 MED ORDER — MORPHINE SULFATE (PF) 4 MG/ML IV SOLN: 2.0000 mg | INTRAVENOUS | Status: DC | PRN

## 2017-04-05 MED ORDER — METHOCARBAMOL 500 MG PO TABS
500.0000 mg | ORAL_TABLET | Freq: Three times a day (TID) | ORAL | Status: DC | PRN
  Administered 2017-04-05 – 2017-04-07 (×3): 500 mg via ORAL
  Filled 2017-04-05 (×3): qty 1

## 2017-04-05 MED ORDER — IOPAMIDOL (ISOVUE-300) INJECTION 61%
INTRAVENOUS | Status: AC
  Administered 2017-04-05: 100 mL
  Filled 2017-04-05: qty 100

## 2017-04-05 NOTE — ED Notes (Signed)
Holding keppra until advised if patient can sit up or not to swallow tablet due to head bleed
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
## 2017-04-05 NOTE — ED Notes (Addendum)
Patient unable to transport or administer Keppra. Patient still off floor with xray

## 2017-04-05 NOTE — ED Notes (Signed)
Per surgical PA Dr. Hulen Skains says pt can sit up but c-collar must remain in place.
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
## 2017-04-05 NOTE — ED Notes (Signed)
Called xray to locate patient. Patient was taken to MRI after xray. Will  Transport when patient returns to floor.
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
## 2017-04-05 NOTE — ED Notes (Signed)
Patient transported to CT 

## 2017-04-05 NOTE — Plan of Care (Signed)
Problem: Activity: Goal: Risk for activity intolerance will decrease Outcome: Progressing Ambulated to the bathroom with assistance on arrival to unit.   Problem: Nutrition: Goal: Adequate nutrition will be maintained Outcome: Progressing Tolerating PO's, but still with some nausea.

## 2017-04-06 ENCOUNTER — Inpatient Hospital Stay (HOSPITAL_COMMUNITY): Payer: 59

## 2017-04-06 LAB — BASIC METABOLIC PANEL
Anion gap: 7 (ref 5–15)
BUN: 7 mg/dL (ref 6–20)
CALCIUM: 8.6 mg/dL — AB (ref 8.9–10.3)
CHLORIDE: 105 mmol/L (ref 101–111)
CO2: 25 mmol/L (ref 22–32)
CREATININE: 0.77 mg/dL (ref 0.44–1.00)
GFR calc non Af Amer: >60 mL/min (ref >60)
GLUCOSE: 107 mg/dL — AB (ref 65–99)
Potassium: 3.5 mmol/L (ref 3.5–5.1)
Sodium: 137 mmol/L (ref 135–145)

## 2017-04-06 LAB — CBC
HEMATOCRIT: 30.3 % — AB (ref 36.0–46.0)
HEMOGLOBIN: 10.2 g/dL — AB (ref 12.0–15.0)
MCH: 32.2 pg (ref 26.0–34.0)
MCHC: 33.7 g/dL (ref 30.0–36.0)
MCV: 95.6 fL (ref 78.0–100.0)
Platelets: 122 10*3/uL — ABNORMAL LOW (ref 150–400)
RBC: 3.17 MIL/uL — ABNORMAL LOW (ref 3.87–5.11)
RDW: 14.1 % (ref 11.5–15.5)
WBC: 5.7 10*3/uL (ref 4.0–10.5)

## 2017-04-06 LAB — MRSA PCR SCREENING: MRSA by PCR: NEGATIVE

## 2017-04-06 LAB — HIV ANTIBODY (ROUTINE TESTING W REFLEX): HIV SCREEN 4TH GENERATION: NONREACTIVE

## 2017-04-06 MED ORDER — NICOTINE 21 MG/24HR TD PT24
21.0000 mg | MEDICATED_PATCH | Freq: Every day | TRANSDERMAL | Status: DC
  Administered 2017-04-06: 21 mg via TRANSDERMAL
  Filled 2017-04-06 (×2): qty 1

## 2017-04-06 NOTE — Progress Notes (Addendum)
Pt seen and examined.  No issues overnight. Feels "sore" this morning but denies neuro symptoms, motor/sensory deficits Feels ready for discharge  EXAM: Temp:  [97.9 F (36.6 C)-98.7 F (37.1 C)] 98.7 F (37.1 C) (06/12 1141) Pulse Rate:  [73-115] 80 (06/12 1141) Resp:  [11-25] 19 (06/12 1141) BP: (113-151)/(51-97) 130/60 (06/12 1141) SpO2:  [93 %-98 %] 96 % (06/12 1141) FiO2 (%):  [21 %] 21 % (06/11 1416) Weight:  [74.1 kg (163 lb 5.8 oz)] 74.1 kg (163 lb 5.8 oz) (06/11 1728) Intake/Output      06/11 0701 - 06/12 0700 06/12 0701 - 06/13 0700   P.O. 840    I.V. (mL/kg) 900 (12.1)    Total Intake(mL/kg) 1740 (23.5)    Urine (mL/kg/hr) 1600 200 (0.4)   Stool 0    Total Output 1600 200   Net +140 -200        Urine Occurrence  1 x   Stool Occurrence 0 x     Awake and alert Follows commands throughout Full strength CN grossly intact  Plan Repeat head CT looks good MRI shows neck strain. C collar for sx relief as necessary Remains neuro intact Cleared from a NS standpoint for discharge Complete 7 day course of Keppra Precautions given Work note provided F/U 4 weeks in office
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
## 2017-04-07 MED ORDER — METHOCARBAMOL 500 MG PO TABS: 500.0000 mg | ORAL_TABLET | Freq: Three times a day (TID) | ORAL | 0 refills | Status: DC | PRN

## 2017-04-07 MED ORDER — LEVETIRACETAM 500 MG PO TABS: 500.0000 mg | ORAL_TABLET | Freq: Two times a day (BID) | ORAL | 0 refills | Status: DC

## 2017-04-07 NOTE — Progress Notes (Signed)
Discharge note. Patient and husband educated on medications and when to take them, soft collar and when patient needs to wear it, follow up appointments, signs and symptoms and when to call the physician/seek medical help. All belongings sent home with patient and family.

## 2017-04-07 NOTE — Progress Notes (Signed)
Upon entering patient's room, RN noted that patient had soft collar off while sitting edge of bed. Patient stated that night shift RN told her it was okay to take it off "to let it air out." Current RN asked night shift about it and was told that the patient stated that "neurosurgery stated that it was okay when she was relaxing to take it off." RN to follow up with MD about patient's need for soft collar.
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
## 2017-04-07 NOTE — Progress Notes (Signed)
Patient discharged in wheelchair by volunteer services.

## 2017-04-12 ENCOUNTER — Telehealth (HOSPITAL_COMMUNITY): Payer: Self-pay

## 2017-04-12 DIAGNOSIS — R0781 Pleurodynia: Secondary | ICD-10-CM | POA: Diagnosis not present

## 2017-04-12 DIAGNOSIS — S299XXA Unspecified injury of thorax, initial encounter: Secondary | ICD-10-CM | POA: Diagnosis not present

## 2017-04-12 NOTE — Telephone Encounter (Signed)
[phone] Raven Lambert says she is having a lot of pain under her breast and it is hard to breathe. She asked if she needed to come back in to have it checked.
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
## 2017-04-16 DIAGNOSIS — R000 Tachycardia, unspecified: Secondary | ICD-10-CM | POA: Diagnosis not present

## 2017-04-16 DIAGNOSIS — I10 Essential (primary) hypertension: Secondary | ICD-10-CM | POA: Diagnosis not present

## 2017-04-16 DIAGNOSIS — I447 Left bundle-branch block, unspecified: Secondary | ICD-10-CM | POA: Insufficient documentation

## 2017-04-16 DIAGNOSIS — Z0000 Encounter for general adult medical examination without abnormal findings: Secondary | ICD-10-CM | POA: Diagnosis not present

## 2017-04-22 DIAGNOSIS — I10 Essential (primary) hypertension: Secondary | ICD-10-CM | POA: Diagnosis not present

## 2017-04-22 DIAGNOSIS — E7800 Pure hypercholesterolemia, unspecified: Secondary | ICD-10-CM | POA: Diagnosis not present

## 2017-04-22 DIAGNOSIS — R000 Tachycardia, unspecified: Secondary | ICD-10-CM | POA: Insufficient documentation

## 2017-04-22 DIAGNOSIS — I447 Left bundle-branch block, unspecified: Secondary | ICD-10-CM | POA: Diagnosis not present

## 2017-04-29 DIAGNOSIS — I493 Ventricular premature depolarization: Secondary | ICD-10-CM | POA: Diagnosis not present

## 2017-05-03 DIAGNOSIS — I609 Nontraumatic subarachnoid hemorrhage, unspecified: Secondary | ICD-10-CM | POA: Diagnosis not present

## 2017-05-03 DIAGNOSIS — I10 Essential (primary) hypertension: Secondary | ICD-10-CM | POA: Diagnosis not present

## 2017-05-10 DIAGNOSIS — Z23 Encounter for immunization: Secondary | ICD-10-CM | POA: Diagnosis not present

## 2017-05-20 DIAGNOSIS — J208 Acute bronchitis due to other specified organisms: Secondary | ICD-10-CM | POA: Diagnosis not present

## 2017-05-20 DIAGNOSIS — J029 Acute pharyngitis, unspecified: Secondary | ICD-10-CM | POA: Diagnosis not present

## 2017-05-20 DIAGNOSIS — B9689 Other specified bacterial agents as the cause of diseases classified elsewhere: Secondary | ICD-10-CM | POA: Diagnosis not present

## 2017-05-21 DIAGNOSIS — I10 Essential (primary) hypertension: Secondary | ICD-10-CM | POA: Diagnosis not present

## 2017-05-21 DIAGNOSIS — I447 Left bundle-branch block, unspecified: Secondary | ICD-10-CM | POA: Diagnosis not present

## 2017-05-21 DIAGNOSIS — R000 Tachycardia, unspecified: Secondary | ICD-10-CM | POA: Diagnosis not present

## 2017-05-26 DIAGNOSIS — E7800 Pure hypercholesterolemia, unspecified: Secondary | ICD-10-CM | POA: Diagnosis not present

## 2017-05-26 DIAGNOSIS — I5181 Takotsubo syndrome: Secondary | ICD-10-CM | POA: Insufficient documentation

## 2017-05-26 DIAGNOSIS — I10 Essential (primary) hypertension: Secondary | ICD-10-CM | POA: Diagnosis not present

## 2017-05-26 DIAGNOSIS — I447 Left bundle-branch block, unspecified: Secondary | ICD-10-CM | POA: Diagnosis not present

## 2017-06-03 DIAGNOSIS — I10 Essential (primary) hypertension: Secondary | ICD-10-CM | POA: Diagnosis not present

## 2017-06-07 DIAGNOSIS — I5181 Takotsubo syndrome: Secondary | ICD-10-CM | POA: Diagnosis not present

## 2017-06-07 DIAGNOSIS — I447 Left bundle-branch block, unspecified: Secondary | ICD-10-CM | POA: Diagnosis not present

## 2017-06-08 DIAGNOSIS — I10 Essential (primary) hypertension: Secondary | ICD-10-CM | POA: Diagnosis not present

## 2017-06-08 DIAGNOSIS — E7800 Pure hypercholesterolemia, unspecified: Secondary | ICD-10-CM | POA: Diagnosis not present

## 2017-06-11 DIAGNOSIS — E7800 Pure hypercholesterolemia, unspecified: Secondary | ICD-10-CM | POA: Diagnosis not present

## 2017-06-11 DIAGNOSIS — I447 Left bundle-branch block, unspecified: Secondary | ICD-10-CM | POA: Diagnosis not present

## 2017-06-11 DIAGNOSIS — I5181 Takotsubo syndrome: Secondary | ICD-10-CM | POA: Diagnosis not present

## 2017-08-11 DIAGNOSIS — Z23 Encounter for immunization: Secondary | ICD-10-CM | POA: Diagnosis not present

## 2017-09-15 DIAGNOSIS — I5181 Takotsubo syndrome: Secondary | ICD-10-CM | POA: Diagnosis not present

## 2017-09-15 DIAGNOSIS — I10 Essential (primary) hypertension: Secondary | ICD-10-CM | POA: Diagnosis not present

## 2017-09-15 DIAGNOSIS — I447 Left bundle-branch block, unspecified: Secondary | ICD-10-CM | POA: Diagnosis not present

## 2017-09-29 DIAGNOSIS — I5181 Takotsubo syndrome: Secondary | ICD-10-CM | POA: Diagnosis not present

## 2017-09-30 DIAGNOSIS — D649 Anemia, unspecified: Secondary | ICD-10-CM | POA: Diagnosis not present

## 2017-09-30 DIAGNOSIS — I10 Essential (primary) hypertension: Secondary | ICD-10-CM | POA: Diagnosis not present

## 2017-09-30 DIAGNOSIS — I5181 Takotsubo syndrome: Secondary | ICD-10-CM | POA: Diagnosis not present

## 2017-10-01 DIAGNOSIS — D649 Anemia, unspecified: Secondary | ICD-10-CM | POA: Insufficient documentation

## 2017-10-07 DIAGNOSIS — I10 Essential (primary) hypertension: Secondary | ICD-10-CM | POA: Diagnosis not present

## 2017-10-07 DIAGNOSIS — E7800 Pure hypercholesterolemia, unspecified: Secondary | ICD-10-CM | POA: Diagnosis not present

## 2017-10-07 DIAGNOSIS — Z9889 Other specified postprocedural states: Secondary | ICD-10-CM | POA: Diagnosis not present

## 2017-11-12 DIAGNOSIS — I447 Left bundle-branch block, unspecified: Secondary | ICD-10-CM | POA: Diagnosis not present

## 2017-11-12 DIAGNOSIS — I5022 Chronic systolic (congestive) heart failure: Secondary | ICD-10-CM | POA: Insufficient documentation

## 2017-12-14 DIAGNOSIS — D649 Anemia, unspecified: Secondary | ICD-10-CM | POA: Diagnosis not present

## 2017-12-14 DIAGNOSIS — I5022 Chronic systolic (congestive) heart failure: Secondary | ICD-10-CM | POA: Diagnosis not present

## 2017-12-14 DIAGNOSIS — I10 Essential (primary) hypertension: Secondary | ICD-10-CM | POA: Diagnosis not present

## 2017-12-15 DIAGNOSIS — Z95810 Presence of automatic (implantable) cardiac defibrillator: Secondary | ICD-10-CM

## 2017-12-15 DIAGNOSIS — I5022 Chronic systolic (congestive) heart failure: Secondary | ICD-10-CM | POA: Diagnosis not present

## 2017-12-15 DIAGNOSIS — I447 Left bundle-branch block, unspecified: Secondary | ICD-10-CM | POA: Diagnosis not present

## 2017-12-15 DIAGNOSIS — I110 Hypertensive heart disease with heart failure: Secondary | ICD-10-CM | POA: Diagnosis not present

## 2017-12-15 DIAGNOSIS — I428 Other cardiomyopathies: Secondary | ICD-10-CM | POA: Diagnosis not present

## 2017-12-15 HISTORY — PX: CARDIAC DEFIBRILLATOR PLACEMENT: SHX171

## 2017-12-15 HISTORY — DX: Presence of automatic (implantable) cardiac defibrillator: Z95.810

## 2017-12-16 DIAGNOSIS — I447 Left bundle-branch block, unspecified: Secondary | ICD-10-CM | POA: Diagnosis not present

## 2017-12-16 DIAGNOSIS — I428 Other cardiomyopathies: Secondary | ICD-10-CM | POA: Diagnosis not present

## 2017-12-16 DIAGNOSIS — R918 Other nonspecific abnormal finding of lung field: Secondary | ICD-10-CM | POA: Diagnosis not present

## 2017-12-16 DIAGNOSIS — I5022 Chronic systolic (congestive) heart failure: Secondary | ICD-10-CM | POA: Diagnosis not present

## 2017-12-22 DIAGNOSIS — I10 Essential (primary) hypertension: Secondary | ICD-10-CM | POA: Diagnosis not present

## 2017-12-27 DIAGNOSIS — D649 Anemia, unspecified: Secondary | ICD-10-CM | POA: Diagnosis not present

## 2017-12-27 DIAGNOSIS — E7800 Pure hypercholesterolemia, unspecified: Secondary | ICD-10-CM | POA: Diagnosis not present

## 2017-12-27 DIAGNOSIS — Z9889 Other specified postprocedural states: Secondary | ICD-10-CM | POA: Diagnosis not present

## 2017-12-27 DIAGNOSIS — I5181 Takotsubo syndrome: Secondary | ICD-10-CM | POA: Diagnosis not present

## 2017-12-27 DIAGNOSIS — I447 Left bundle-branch block, unspecified: Secondary | ICD-10-CM | POA: Diagnosis not present

## 2018-03-11 DIAGNOSIS — I447 Left bundle-branch block, unspecified: Secondary | ICD-10-CM | POA: Diagnosis not present

## 2018-03-11 DIAGNOSIS — Z95810 Presence of automatic (implantable) cardiac defibrillator: Secondary | ICD-10-CM | POA: Diagnosis not present

## 2018-03-11 DIAGNOSIS — I5022 Chronic systolic (congestive) heart failure: Secondary | ICD-10-CM | POA: Diagnosis not present

## 2018-03-22 DIAGNOSIS — I5022 Chronic systolic (congestive) heart failure: Secondary | ICD-10-CM | POA: Diagnosis not present

## 2018-03-22 DIAGNOSIS — I447 Left bundle-branch block, unspecified: Secondary | ICD-10-CM | POA: Diagnosis not present

## 2018-03-22 DIAGNOSIS — Z4502 Encounter for adjustment and management of automatic implantable cardiac defibrillator: Secondary | ICD-10-CM | POA: Diagnosis not present

## 2018-03-29 DIAGNOSIS — I10 Essential (primary) hypertension: Secondary | ICD-10-CM | POA: Diagnosis not present

## 2018-03-29 DIAGNOSIS — I447 Left bundle-branch block, unspecified: Secondary | ICD-10-CM | POA: Diagnosis not present

## 2018-03-29 DIAGNOSIS — I5181 Takotsubo syndrome: Secondary | ICD-10-CM | POA: Diagnosis not present

## 2018-05-31 ENCOUNTER — Other Ambulatory Visit: Payer: Self-pay | Admitting: Internal Medicine

## 2018-05-31 DIAGNOSIS — Z780 Asymptomatic menopausal state: Secondary | ICD-10-CM | POA: Diagnosis not present

## 2018-05-31 DIAGNOSIS — Z0000 Encounter for general adult medical examination without abnormal findings: Secondary | ICD-10-CM | POA: Diagnosis not present

## 2018-05-31 DIAGNOSIS — I10 Essential (primary) hypertension: Secondary | ICD-10-CM | POA: Diagnosis not present

## 2018-05-31 DIAGNOSIS — Z23 Encounter for immunization: Secondary | ICD-10-CM | POA: Diagnosis not present

## 2018-05-31 DIAGNOSIS — Z1231 Encounter for screening mammogram for malignant neoplasm of breast: Secondary | ICD-10-CM

## 2018-06-06 DIAGNOSIS — Z780 Asymptomatic menopausal state: Secondary | ICD-10-CM | POA: Diagnosis not present

## 2018-06-17 ENCOUNTER — Ambulatory Visit
Admission: RE | Admit: 2018-06-17 | Discharge: 2018-06-17 | Disposition: A | Discharge status: Home / Self Care | Payer: 59 | Source: Ambulatory Visit | Attending: Internal Medicine | Admitting: Internal Medicine

## 2018-06-17 DIAGNOSIS — Z1231 Encounter for screening mammogram for malignant neoplasm of breast: Secondary | ICD-10-CM | POA: Diagnosis not present

## 2018-06-28 DIAGNOSIS — I10 Essential (primary) hypertension: Secondary | ICD-10-CM | POA: Diagnosis not present

## 2018-06-30 DIAGNOSIS — I10 Essential (primary) hypertension: Secondary | ICD-10-CM | POA: Diagnosis not present

## 2018-06-30 DIAGNOSIS — I447 Left bundle-branch block, unspecified: Secondary | ICD-10-CM | POA: Diagnosis not present

## 2018-06-30 DIAGNOSIS — I5181 Takotsubo syndrome: Secondary | ICD-10-CM | POA: Diagnosis not present

## 2018-07-04 DIAGNOSIS — J4540 Moderate persistent asthma, uncomplicated: Secondary | ICD-10-CM | POA: Diagnosis not present

## 2018-07-04 DIAGNOSIS — I10 Essential (primary) hypertension: Secondary | ICD-10-CM | POA: Diagnosis not present

## 2018-07-04 DIAGNOSIS — D721 Eosinophilia: Secondary | ICD-10-CM | POA: Diagnosis not present

## 2018-07-05 DIAGNOSIS — I5022 Chronic systolic (congestive) heart failure: Secondary | ICD-10-CM | POA: Diagnosis not present

## 2018-09-26 DIAGNOSIS — I5022 Chronic systolic (congestive) heart failure: Secondary | ICD-10-CM | POA: Diagnosis not present

## 2018-09-26 DIAGNOSIS — I447 Left bundle-branch block, unspecified: Secondary | ICD-10-CM | POA: Diagnosis not present

## 2018-09-26 DIAGNOSIS — I10 Essential (primary) hypertension: Secondary | ICD-10-CM | POA: Diagnosis not present

## 2018-10-04 DIAGNOSIS — I5022 Chronic systolic (congestive) heart failure: Secondary | ICD-10-CM | POA: Diagnosis not present

## 2018-12-27 DIAGNOSIS — J4540 Moderate persistent asthma, uncomplicated: Secondary | ICD-10-CM | POA: Diagnosis not present

## 2019-01-03 DIAGNOSIS — J4540 Moderate persistent asthma, uncomplicated: Secondary | ICD-10-CM | POA: Insufficient documentation

## 2019-01-03 DIAGNOSIS — D649 Anemia, unspecified: Secondary | ICD-10-CM | POA: Diagnosis not present

## 2019-01-03 DIAGNOSIS — I10 Essential (primary) hypertension: Secondary | ICD-10-CM | POA: Diagnosis not present

## 2019-01-17 DIAGNOSIS — Z86010 Personal history of colonic polyps: Secondary | ICD-10-CM | POA: Diagnosis not present

## 2019-01-17 DIAGNOSIS — I5022 Chronic systolic (congestive) heart failure: Secondary | ICD-10-CM | POA: Diagnosis not present

## 2019-01-17 DIAGNOSIS — Z95810 Presence of automatic (implantable) cardiac defibrillator: Secondary | ICD-10-CM | POA: Diagnosis not present
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
## 2019-02-15 DIAGNOSIS — I5022 Chronic systolic (congestive) heart failure: Secondary | ICD-10-CM | POA: Diagnosis not present

## 2019-02-15 DIAGNOSIS — I10 Essential (primary) hypertension: Secondary | ICD-10-CM | POA: Diagnosis not present

## 2019-02-15 DIAGNOSIS — I447 Left bundle-branch block, unspecified: Secondary | ICD-10-CM | POA: Diagnosis not present

## 2019-06-23 ENCOUNTER — Other Ambulatory Visit
Admission: RE | Admit: 2019-06-23 | Discharge: 2019-06-23 | Disposition: A | Discharge status: Home / Self Care | Payer: 59 | Source: Ambulatory Visit | Attending: Internal Medicine | Admitting: Internal Medicine

## 2019-06-23 ENCOUNTER — Other Ambulatory Visit: Payer: Self-pay

## 2019-06-23 DIAGNOSIS — Z20828 Contact with and (suspected) exposure to other viral communicable diseases: Secondary | ICD-10-CM | POA: Insufficient documentation

## 2019-06-23 DIAGNOSIS — Z01812 Encounter for preprocedural laboratory examination: Secondary | ICD-10-CM | POA: Diagnosis present

## 2019-06-23 LAB — SARS CORONAVIRUS 2 (TAT 6-24 HRS): SARS Coronavirus 2: NEGATIVE

## 2019-06-27 ENCOUNTER — Ambulatory Visit
Admission: RE | Admit: 2019-06-27 | Discharge: 2019-06-27 | Disposition: A | Discharge status: Home / Self Care | Payer: 59 | Attending: Internal Medicine | Admitting: Internal Medicine

## 2019-06-27 ENCOUNTER — Other Ambulatory Visit: Payer: Self-pay

## 2019-06-27 ENCOUNTER — Ambulatory Visit: Payer: 59 | Admitting: Certified Registered Nurse Anesthetist

## 2019-06-27 ENCOUNTER — Encounter
Admission: RE | Disposition: A | Discharge status: Home / Self Care | Payer: Self-pay | Source: Home / Self Care | Attending: Internal Medicine

## 2019-06-27 DIAGNOSIS — E7800 Pure hypercholesterolemia, unspecified: Secondary | ICD-10-CM | POA: Diagnosis not present

## 2019-06-27 DIAGNOSIS — Z8542 Personal history of malignant neoplasm of other parts of uterus: Secondary | ICD-10-CM | POA: Insufficient documentation

## 2019-06-27 DIAGNOSIS — F17210 Nicotine dependence, cigarettes, uncomplicated: Secondary | ICD-10-CM | POA: Diagnosis not present

## 2019-06-27 DIAGNOSIS — Z86010 Personal history of colonic polyps: Secondary | ICD-10-CM | POA: Insufficient documentation

## 2019-06-27 DIAGNOSIS — M1990 Unspecified osteoarthritis, unspecified site: Secondary | ICD-10-CM | POA: Diagnosis not present

## 2019-06-27 DIAGNOSIS — D123 Benign neoplasm of transverse colon: Secondary | ICD-10-CM | POA: Diagnosis not present

## 2019-06-27 DIAGNOSIS — Z95810 Presence of automatic (implantable) cardiac defibrillator: Secondary | ICD-10-CM | POA: Insufficient documentation

## 2019-06-27 DIAGNOSIS — I10 Essential (primary) hypertension: Secondary | ICD-10-CM | POA: Diagnosis not present

## 2019-06-27 DIAGNOSIS — D122 Benign neoplasm of ascending colon: Secondary | ICD-10-CM | POA: Insufficient documentation

## 2019-06-27 DIAGNOSIS — D125 Benign neoplasm of sigmoid colon: Secondary | ICD-10-CM | POA: Insufficient documentation

## 2019-06-27 DIAGNOSIS — D649 Anemia, unspecified: Secondary | ICD-10-CM | POA: Insufficient documentation

## 2019-06-27 DIAGNOSIS — Z1211 Encounter for screening for malignant neoplasm of colon: Secondary | ICD-10-CM | POA: Insufficient documentation

## 2019-06-27 DIAGNOSIS — Z79899 Other long term (current) drug therapy: Secondary | ICD-10-CM | POA: Insufficient documentation

## 2019-06-27 DIAGNOSIS — K640 First degree hemorrhoids: Secondary | ICD-10-CM | POA: Insufficient documentation

## 2019-06-27 HISTORY — DX: Anemia, unspecified: D64.9

## 2019-06-27 HISTORY — PX: COLONOSCOPY WITH PROPOFOL: SHX5780

## 2019-06-27 HISTORY — DX: Family history of other specified conditions: Z84.89

## 2019-06-27 SURGERY — COLONOSCOPY WITH PROPOFOL
Anesthesia: General

## 2019-06-27 MED ORDER — SODIUM CHLORIDE 0.9 % IV SOLN
INTRAVENOUS | Status: DC
  Administered 2019-06-27: 1000 mL via INTRAVENOUS

## 2019-06-27 MED ORDER — PROPOFOL 10 MG/ML IV BOLUS
INTRAVENOUS | Status: DC | PRN
  Administered 2019-06-27: 50 mg via INTRAVENOUS
  Administered 2019-06-27: 20 mg via INTRAVENOUS

## 2019-06-27 MED ORDER — LIDOCAINE HCL (CARDIAC) PF 100 MG/5ML IV SOSY
PREFILLED_SYRINGE | INTRAVENOUS | Status: DC | PRN
  Administered 2019-06-27: 50 mg via INTRAVENOUS

## 2019-06-27 MED ORDER — PHENYLEPHRINE HCL (PRESSORS) 10 MG/ML IV SOLN
INTRAVENOUS | Status: DC | PRN
  Administered 2019-06-27: 100 ug via INTRAVENOUS
  Administered 2019-06-27: 200 ug via INTRAVENOUS

## 2019-06-27 MED ORDER — PROPOFOL 500 MG/50ML IV EMUL
INTRAVENOUS | Status: DC | PRN
  Administered 2019-06-27: 150 ug/kg/min via INTRAVENOUS

## 2019-06-27 MED ORDER — PROPOFOL 500 MG/50ML IV EMUL
INTRAVENOUS | Status: AC
  Filled 2019-06-27: qty 50

## 2019-06-27 MED ORDER — PROPOFOL 10 MG/ML IV BOLUS
INTRAVENOUS | Status: AC
  Filled 2019-06-27: qty 20

## 2019-06-27 NOTE — Interval H&P Note (Signed)
History and Physical Interval Note:  06/27/2019 11:30 AM  Raven Lambert  has presented today for surgery, with the diagnosis of PH POLYPS.  The various methods of treatment have been discussed with the patient and family. After consideration of risks, benefits and other options for treatment, the patient has consented to  Procedure(s): COLONOSCOPY WITH PROPOFOL (N/A) as a surgical intervention.  The patient's history has been reviewed, patient examined, no change in status, stable for surgery.  I have reviewed the patient's chart and labs.  Questions were answered to the patient's satisfaction.     [place], [place]

## 2019-06-27 NOTE — H&P (Signed)
Outpatient short stay form Pre-procedure 06/27/2019 10:05 AM Teodoro K. Alice Reichert, M.D.  Primary Physician: Adrian Prows, M.D.  Reason for visit:  Personal hx of colon polyps (tubular adenomas)  History of present illness: 63 year old patient with a history of heart arrhythmia status post defibrillator placement presents for colon polyp surveillance.  Last colonoscopy performed in 2014 revealed tubular adenomas, she did not follow-up for 3-year callback in 2017.Patient denies change in bowel habits, rectal bleeding, weight loss or abdominal pain.    No current facility-administered medications for this encounter.   Current Outpatient Medications:  .  lisinopril (ZESTRIL) 40 MG tablet, Take 40 mg by mouth daily., Disp: , Rfl:  .  metoprolol succinate (TOPROL-XL) 50 MG 24 hr tablet, Take 50 mg by mouth daily. Take with or immediately following a meal., Disp: , Rfl:  .  montelukast (SINGULAIR) 10 MG tablet, Take 10 mg by mouth at bedtime., Disp: , Rfl:  .  amLODipine (NORVASC) 5 MG tablet, Take 5 mg by mouth daily., Disp: , Rfl:  .  levETIRAcetam (KEPPRA) 500 MG tablet, Take 1 tablet (500 mg total) by mouth 2 (two) times daily., Disp: 9 tablet, Rfl: 0 .  methocarbamol (ROBAXIN) 500 MG tablet, Take 1 tablet (500 mg total) by mouth every 8 (eight) hours as needed for muscle spasms., Disp: 15 tablet, Rfl: 0 .  Multiple Vitamin (MULTIVITAMIN) tablet, Take 1 tablet by mouth daily., Disp: , Rfl:   No medications prior to admission.     Allergies  Allergen Reactions  . Percocet [Oxycodone-Acetaminophen] Itching     Past Medical History:  Diagnosis Date  . Arthritis   . Cancer ([place])    uterine  . High cholesterol    pt states only while not fasting  . Hypertension     Review of systems:  Otherwise negative.    Physical Exam  Gen: Alert, oriented. Appears stated age.  HEENT: [place]/AT. PERRLA. Lungs: CTA, no wheezes. CV: RR nl S1, S2. Abd: soft, benign, no masses. BS+ Ext: No  edema. Pulses 2+    Planned procedures: Proceed with colonoscopy. The patient understands the nature of the planned procedure, indications, risks, alternatives and potential complications including but not limited to bleeding, infection, perforation, damage to internal organs and possible oversedation/side effects from anesthesia. The patient agrees and gives consent to proceed.  Please refer to procedure notes for findings, recommendations and patient disposition/instructions.     Teodoro K. Alice Reichert, M.D. Gastroenterology 06/27/2019  10:05 AM

## 2019-06-27 NOTE — Op Note (Addendum)
[facility] Gastroenterology Patient Name: Raven Lambert Procedure Date: 06/27/2019 11:21 AM MRN: [id number] Account #: [id number] Date of Birth: Jul 17, 1956 Admit Type: Outpatient Age: 63 Room: [facility] ENDO ROOM 2 Gender: Female Note Status: Finalized Procedure:            Colonoscopy Indications:          Surveillance: Personal history of adenomatous polyps on                        last colonoscopy > 5 years ago Providers:            Lorie Apley K. Bowe Sidor MD, MD Medicines:            Propofol per Anesthesia Complications:        No immediate complications. Procedure:            Pre-Anesthesia Assessment:                       - The risks and benefits of the procedure and the                        sedation options and risks were discussed with the                        patient. All questions were answered and informed                        consent was obtained.                       - Patient identification and proposed procedure were                        verified prior to the procedure by the nurse. The                        procedure was verified in the procedure room.                       - ASA Grade Assessment: III - A patient with severe                        systemic disease.                       - After reviewing the risks and benefits, the patient                        was deemed in satisfactory condition to undergo the                        procedure.                       After obtaining informed consent, the colonoscope was                        passed under direct vision. Throughout the procedure,                        the patient's blood pressure, pulse, and oxygen  saturations were monitored continuously. The                        Colonoscope was introduced through the anus and                        advanced to the the cecum, identified by appendiceal                        orifice and ileocecal valve. The colonoscopy  was                        performed without difficulty. The patient tolerated the                        procedure well. The quality of the bowel preparation                        was excellent. The ileocecal valve, appendiceal                        orifice, and rectum were photographed. Findings:      The perianal and digital rectal examinations were normal. Pertinent       negatives include normal sphincter tone and no palpable rectal lesions.      Non-bleeding internal hemorrhoids were found during retroflexion. The       hemorrhoids were Grade I (internal hemorrhoids that do not prolapse).      A 5 mm polyp was found in the mid transverse colon. The polyp was       sessile. The polyp was removed with a jumbo cold forceps. Resection and       retrieval were complete.      A 7 mm polyp was found in the ascending colon. The polyp was sessile.       The polyp was removed with a cold snare. Resection and retrieval were       complete.      A 5 mm polyp was found in the sigmoid colon. The polyp was sessile. The       polyp was removed with a cold snare. Resection was complete, but the       polyp tissue was not retrieved.      The exam was otherwise without abnormality. Impression:           - Non-bleeding internal hemorrhoids.                       - One 5 mm polyp in the mid transverse colon, removed                        with a jumbo cold forceps. Resected and retrieved.                       - One 7 mm polyp in the ascending colon, removed with a                        cold snare. Resected and retrieved.                       - One 5 mm polyp in the sigmoid colon, removed with a  cold snare. Complete resection. Polyp tissue not                        retrieved.                       - The examination was otherwise normal. Recommendation:       - Patient has a contact number available for                        emergencies. The signs and symptoms of potential                         delayed complications were discussed with the patient.                        Return to normal activities tomorrow. Written discharge                        instructions were provided to the patient.                       - Resume previous diet.                       - Continue present medications.                       - Await pathology results.                       - Repeat colonoscopy date to be determined after                        pending pathology results are reviewed for surveillance.                       - Return to GI office PRN.                       - The findings and recommendations were discussed with                        the patient. Procedure Code(s):    --- Professional ---                       [phone], Colonoscopy, flexible; with removal of tumor(s),                        polyp(s), or other lesion(s) by snare technique                       45380, 46, Colonoscopy, flexible; with biopsy, single                        or multiple Diagnosis Code(s):    --- Professional ---                       K64.0, First degree hemorrhoids                       K63.5, Polyp of colon  Z86.010, Personal history of colonic polyps CPT copyright 2019 American Medical Association. All rights reserved. The codes documented in this report are preliminary and upon coder review may  be revised to meet current compliance requirements. Efrain Sella MD, MD 06/27/2019 11:53:05 AM This report has been signed electronically. Number of Addenda: 0 Note Initiated On: 06/27/2019 11:21 AM Scope Withdrawal Time: 0 hours 8 minutes 56 seconds  Total Procedure Duration: 0 hours 16 minutes 22 seconds  Estimated Blood Loss: Estimated blood loss: none.      [facility]

## 2019-06-27 NOTE — Anesthesia Preprocedure Evaluation (Signed)
Anesthesia Evaluation  Patient identified by MRN, date of birth, ID band Patient awake    Reviewed: Allergy & Precautions, NPO status , Patient's Chart, lab work & pertinent test results  History of Anesthesia Complications Negative for: history of anesthetic complications  Airway Mallampati: II       Dental   Pulmonary neg sleep apnea, neg COPD, Current Smoker,           Cardiovascular hypertension, Pt. on medications (-) Past MI and (-) CHF (-) dysrhythmias + pacemaker + Cardiac Defibrillator (-) Valvular Problems/Murmurs     Neuro/Psych neg Seizures    GI/Hepatic Neg liver ROS, neg GERD  ,  Endo/Other  neg diabetes  Renal/GU negative Renal ROS     Musculoskeletal   Abdominal   Peds  Hematology  (+) anemia ,   Anesthesia Other Findings   Reproductive/Obstetrics                             Anesthesia Physical Anesthesia Plan  ASA: III  Anesthesia Plan: General   Post-op Pain Management:    Induction: Intravenous  PONV Risk Score and Plan: 2 and TIVA and Propofol infusion  Airway Management Planned: Nasal Cannula  Additional Equipment:   Intra-op Plan:   Post-operative Plan:   Informed Consent: I have reviewed the patients History and Physical, chart, labs and discussed the procedure including the risks, benefits and alternatives for the proposed anesthesia with the patient or authorized representative who has indicated his/her understanding and acceptance.       Plan Discussed with:   Anesthesia Plan Comments:         Anesthesia Quick Evaluation

## 2019-06-27 NOTE — Anesthesia Post-op Follow-up Note (Signed)
Anesthesia QCDR form completed.        

## 2019-06-27 NOTE — Transfer of Care (Signed)
Immediate Anesthesia Transfer of Care Note  Patient: Raven Lambert  Procedure(s) Performed: COLONOSCOPY WITH PROPOFOL (N/A )  Patient Location: PACU  Anesthesia Type:General  Level of Consciousness: awake, alert  and oriented  Airway & Oxygen Therapy: Patient Spontanous Breathing  Post-op Assessment: Report given to RN and Post -op Vital signs reviewed and stable  Post vital signs: Reviewed and stable  Last Vitals:  Vitals Value Taken Time  BP    Temp    Pulse 60 06/27/19 1153  Resp 16 06/27/19 1153  SpO2 97 % 06/27/19 1153  Vitals shown include unvalidated device data.  Last Pain:  Vitals:   06/27/19 1107  TempSrc: Tympanic  PainSc: 0-No pain         Complications: No apparent anesthesia complications

## 2019-06-27 NOTE — Anesthesia Postprocedure Evaluation (Signed)
Anesthesia Post Note  Patient: Raven Lambert  Procedure(s) Performed: COLONOSCOPY WITH PROPOFOL (N/A )  Patient location during evaluation: Endoscopy Anesthesia Type: General Level of consciousness: awake and alert Pain management: pain level controlled Vital Signs Assessment: post-procedure vital signs reviewed and stable Respiratory status: spontaneous breathing and respiratory function stable Cardiovascular status: stable Anesthetic complications: no     Last Vitals:  Vitals:   06/27/19 1204 06/27/19 1214  BP: (!) 97/44 (!) 109/53  Pulse: 60 66  Resp: 16 15  Temp:    SpO2: 100% 100%    Last Pain:  Vitals:   06/27/19 1214  TempSrc:   PainSc: 0-No pain                 KEPHART,WILLIAM K

## 2019-06-28 LAB — SURGICAL PATHOLOGY

## 2019-07-05 ENCOUNTER — Other Ambulatory Visit: Payer: Self-pay | Admitting: Infectious Diseases

## 2019-07-05 DIAGNOSIS — Z1231 Encounter for screening mammogram for malignant neoplasm of breast: Secondary | ICD-10-CM

## 2019-07-11 ENCOUNTER — Ambulatory Visit
Admission: RE | Admit: 2019-07-11 | Discharge: 2019-07-11 | Disposition: A | Discharge status: Home / Self Care | Payer: 59 | Source: Ambulatory Visit | Attending: Cardiovascular Disease | Admitting: Cardiovascular Disease

## 2019-07-11 DIAGNOSIS — Z1231 Encounter for screening mammogram for malignant neoplasm of breast: Secondary | ICD-10-CM | POA: Diagnosis not present

## 2019-12-31 ENCOUNTER — Ambulatory Visit: Payer: 59 | Attending: Internal Medicine

## 2019-12-31 DIAGNOSIS — Z23 Encounter for immunization: Secondary | ICD-10-CM | POA: Insufficient documentation

## 2019-12-31 NOTE — Progress Notes (Signed)
   Covid-19 Vaccination Clinic  Name:  Raven Lambert    MRN: [id number] DOB: [birth year]  12/31/2019  [name] was observed post Covid-19 immunization for 15 minutes without incident. She was provided with Vaccine Information Sheet and instruction to access the V-Safe system.   [name] was instructed to call 911 with any severe reactions post vaccine: Marland Kitchen Difficulty breathing  . Swelling of face and throat  . A fast heartbeat  . A bad rash all over body  . Dizziness and weakness   Immunizations Administered    Name Date Dose VIS Date Route   Pfizer COVID-19 Vaccine 12/31/2019  8:38 AM 0.3 mL 10/06/2019 Intramuscular   Manufacturer: [place]   Lot: [id number]   [place]: [id number]

## 2020-01-22 ENCOUNTER — Ambulatory Visit: Payer: 59 | Attending: Internal Medicine

## 2020-01-22 DIAGNOSIS — Z23 Encounter for immunization: Secondary | ICD-10-CM

## 2020-01-22 NOTE — Progress Notes (Signed)
   Covid-19 Vaccination Clinic  Name:  Raven Lambert    MRN: [id number] DOB: [birth year]  01/22/2020  Raven Lambert was observed post Covid-19 immunization for 15 minutes without incident. She was provided with Vaccine Information Sheet and instruction to access the V-Safe system.   Raven Lambert was instructed to call 911 with any severe reactions post vaccine: Marland Kitchen Difficulty breathing  . Swelling of face and throat  . A fast heartbeat  . A bad rash all over body  . Dizziness and weakness   Immunizations Administered    Name Date Dose VIS Date Route   Pfizer COVID-19 Vaccine 01/22/2020  8:31 AM 0.3 mL 10/06/2019 Intramuscular   Manufacturer: [place]   Lot: [id number]   [place]: [id number]

## 2020-10-21 ENCOUNTER — Other Ambulatory Visit: Payer: Self-pay | Admitting: Infectious Diseases

## 2020-10-21 DIAGNOSIS — Z1231 Encounter for screening mammogram for malignant neoplasm of breast: Secondary | ICD-10-CM

## 2021-02-11 ENCOUNTER — Inpatient Hospital Stay: Payer: 59 | Attending: Oncology | Admitting: Oncology

## 2021-02-11 ENCOUNTER — Encounter: Payer: Self-pay | Admitting: Oncology

## 2021-02-11 ENCOUNTER — Inpatient Hospital Stay: Payer: 59

## 2021-02-11 DIAGNOSIS — F17200 Nicotine dependence, unspecified, uncomplicated: Secondary | ICD-10-CM | POA: Diagnosis not present

## 2021-02-11 DIAGNOSIS — D696 Thrombocytopenia, unspecified: Secondary | ICD-10-CM | POA: Insufficient documentation

## 2021-02-11 DIAGNOSIS — D539 Nutritional anemia, unspecified: Secondary | ICD-10-CM | POA: Diagnosis not present

## 2021-02-11 DIAGNOSIS — Z79899 Other long term (current) drug therapy: Secondary | ICD-10-CM | POA: Diagnosis not present

## 2021-02-11 LAB — FOLATE: Folate: 5 ng/mL — ABNORMAL LOW (ref >5.9)

## 2021-02-11 LAB — CBC WITH DIFFERENTIAL/PLATELET
Abs Immature Granulocytes: 0.01 10*3/uL (ref 0.00–0.07)
Basophils Absolute: 0 10*3/uL (ref 0.0–0.1)
Basophils Relative: 0 %
Eosinophils Absolute: 0.4 10*3/uL (ref 0.0–0.5)
Eosinophils Relative: 8 %
HCT: 32.4 % — ABNORMAL LOW (ref 36.0–46.0)
Hemoglobin: 11.5 g/dL — ABNORMAL LOW (ref 12.0–15.0)
Immature Granulocytes: 0 %
Lymphocytes Relative: 26 %
Lymphs Abs: 1.4 10*3/uL (ref 0.7–4.0)
MCH: 35.1 pg — ABNORMAL HIGH (ref 26.0–34.0)
MCHC: 35.5 g/dL (ref 30.0–36.0)
MCV: 98.8 fL (ref 80.0–100.0)
Monocytes Absolute: 0.4 10*3/uL (ref 0.1–1.0)
Monocytes Relative: 7 %
Neutro Abs: 3.1 10*3/uL (ref 1.7–7.7)
Neutrophils Relative %: 59 %
Platelets: 124 10*3/uL — ABNORMAL LOW (ref 150–400)
RBC: 3.28 MIL/uL — ABNORMAL LOW (ref 3.87–5.11)
RDW: 14.3 % (ref 11.5–15.5)
WBC: 5.3 10*3/uL (ref 4.0–10.5)
nRBC: 0 % (ref 0.0–0.2)

## 2021-02-11 LAB — COMPREHENSIVE METABOLIC PANEL
ALT: 17 U/L (ref 0–44)
AST: 17 U/L (ref 15–41)
Albumin: 4.2 g/dL (ref 3.5–5.0)
Alkaline Phosphatase: 91 U/L (ref 38–126)
Anion gap: 11 (ref 5–15)
BUN: 8 mg/dL (ref 8–23)
CO2: 25 mmol/L (ref 22–32)
Calcium: 9.6 mg/dL (ref 8.9–10.3)
Chloride: 101 mmol/L (ref 98–111)
Creatinine, Ser: 0.79 mg/dL (ref 0.44–1.00)
GFR, Estimated: >60 mL/min (ref >60)
Glucose, Bld: 114 mg/dL — ABNORMAL HIGH (ref 70–99)
Potassium: 4.5 mmol/L (ref 3.5–5.1)
Sodium: 137 mmol/L (ref 135–145)
Total Bilirubin: 0.6 mg/dL (ref 0.3–1.2)
Total Protein: 7.1 g/dL (ref 6.5–8.1)

## 2021-02-11 LAB — RETICULOCYTES
Immature Retic Fract: 18.6 % — ABNORMAL HIGH (ref 2.3–15.9)
RBC.: 3.27 MIL/uL — ABNORMAL LOW (ref 3.87–5.11)
Retic Count, Absolute: 89.3 10*3/uL (ref 19.0–186.0)
Retic Ct Pct: 2.7 % (ref 0.4–3.1)

## 2021-02-11 LAB — IRON AND TIBC
Iron: 57 ug/dL (ref 28–170)
Saturation Ratios: 19 % (ref 10.4–31.8)
TIBC: 298 ug/dL (ref 250–450)
UIBC: 241 ug/dL

## 2021-02-11 LAB — FERRITIN: Ferritin: 174 ng/mL (ref 11–307)

## 2021-02-11 LAB — TSH: TSH: 0.936 u[IU]/mL (ref 0.350–4.500)

## 2021-02-11 NOTE — Progress Notes (Signed)
Pt is new pt. She states that on the tope of both hands has swelling and red and it comes and goes. She also says that she bruises easily.

## 2021-02-12 ENCOUNTER — Encounter: Payer: Self-pay | Admitting: Oncology

## 2021-02-12 LAB — KAPPA/LAMBDA LIGHT CHAINS
Kappa free light chain: 25.5 mg/L — ABNORMAL HIGH (ref 3.3–19.4)
Kappa, lambda light chain ratio: 1.43 (ref 0.26–1.65)
Lambda free light chains: 17.8 mg/L (ref 5.7–26.3)

## 2021-02-12 LAB — HAPTOGLOBIN: Haptoglobin: 52 mg/dL (ref 37–355)
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
## 2021-02-13 LAB — MULTIPLE MYELOMA PANEL, SERUM
Albumin SerPl Elph-Mcnc: 4.1 g/dL (ref 2.9–4.4)
Albumin/Glob SerPl: 1.6 (ref 0.7–1.7)
Alpha 1: 0.3 g/dL (ref 0.0–0.4)
Alpha2 Glob SerPl Elph-Mcnc: 0.6 g/dL (ref 0.4–1.0)
B-Globulin SerPl Elph-Mcnc: 0.9 g/dL (ref 0.7–1.3)
Gamma Glob SerPl Elph-Mcnc: 0.9 g/dL (ref 0.4–1.8)
Globulin, Total: 2.7 g/dL (ref 2.2–3.9)
IgA: 192 mg/dL (ref 87–352)
IgG (Immunoglobin G), Serum: 1012 mg/dL (ref 586–1602)
IgM (Immunoglobulin M), Srm: 75 mg/dL (ref 26–217)
Total Protein ELP: 6.8 g/dL (ref 6.0–8.5)

## 2021-02-25 ENCOUNTER — Inpatient Hospital Stay: Payer: 59 | Attending: Oncology | Admitting: Oncology

## 2021-02-25 ENCOUNTER — Encounter: Payer: Self-pay | Admitting: Oncology

## 2021-02-25 DIAGNOSIS — D539 Nutritional anemia, unspecified: Secondary | ICD-10-CM

## 2021-02-25 DIAGNOSIS — E538 Deficiency of other specified B group vitamins: Secondary | ICD-10-CM

## 2021-02-25 DIAGNOSIS — D696 Thrombocytopenia, unspecified: Secondary | ICD-10-CM

## 2021-02-25 MED ORDER — FOLIC ACID 1 MG PO TABS: 1.0000 mg | ORAL_TABLET | Freq: Every day | ORAL | 5 refills | Status: DC

## 2021-03-01 NOTE — Progress Notes (Signed)
I connected with Lenox Ahr on 03/01/21 at  1:15 PM EDT by video enabled telemedicine visit and verified that I am speaking with the correct person using two identifiers.   I discussed the limitations, risks, security and privacy concerns of performing an evaluation and management service by telemedicine and the availability of in-person appointments. I also discussed with the patient that there may be a patient responsible charge related to this service. The patient expressed understanding and agreed to proceed.  Other persons participating in the visit and their role in the encounter:  none  Patient's location:  home Provider's location:  work  Risk analyst Complaint: Discuss results of blood work  History of present illness: patient is a 65 year old female with a past medical history significant for  CHF s/p AICD placement, hypertension hyperlipidemia among other medical problems.  She has been referred to [place] for anemia and thrombocytopenia.  Looking back at her prior CBCs patient's hemoglobin has been mostly been between 10-11 since March 2020.  Prior to that her hemoglobin was consistently more than 12.  Over the last 1 year her hemoglobin has drifted down from 11.7-10.9.  Gradual onset macrocytosis.  Platelet count presently is 109 and has been gradually drifting down since 2019 when it was 138.   Results of blood work from 02/11/2021 were as follows: CBC showed white count of 5.3, H&H of 11.5/32.4 with an MCV of 98.8 and a platelet count of 124.  Folate levels were low at 5.  CMP was unremarkable.  Myeloma panel showed no M protein.  Haptoglobin, TSH, reticulocyte count normal.  Iron studies and ferritin normal.   Interval history patient reports some ongoing fatigue but is otherwise doing well and denies any other complaints at this time   Review of Systems  Constitutional: Positive for malaise/fatigue. Negative for chills, fever and weight loss.  HENT: Negative for congestion, ear  discharge and nosebleeds.   Eyes: Negative for blurred vision.  Respiratory: Negative for cough, hemoptysis, sputum production, shortness of breath and wheezing.   Cardiovascular: Negative for chest pain, palpitations, orthopnea and claudication.  Gastrointestinal: Negative for abdominal pain, blood in stool, constipation, diarrhea, heartburn, melena, nausea and vomiting.  Genitourinary: Negative for dysuria, flank pain, frequency, hematuria and urgency.  Musculoskeletal: Negative for back pain, joint pain and myalgias.  Skin: Negative for rash.  Neurological: Negative for dizziness, tingling, focal weakness, seizures, weakness and headaches.  Endo/Heme/Allergies: Does not bruise/bleed easily.  Psychiatric/Behavioral: Negative for depression and suicidal ideas. The patient does not have insomnia.     Allergies  Allergen Reactions  . Percocet [Oxycodone-Acetaminophen] Itching    Past Medical History:  Diagnosis Date  . AICD (automatic cardioverter/defibrillator) present 12/15/2017   [employer]tronic  . Anemia   . Arthritis   . Cancer ([place]) 1988   uterine  . Chronic systolic HF (heart failure) ([place])   . Family history of adverse reaction to anesthesia   . High cholesterol    pt states only while not fasting  . History of colon polyps   . Hypertension   . LBBB (left bundle branch block)   . Reactive airway disease, severe persistent, uncomplicated   . Rotator cuff (capsule) sprain   . Stress-induced cardiomyopathy   . Tachycardia     Past Surgical History:  Procedure Laterality Date  . ABDOMINAL HYSTERECTOMY    . CARDIAC DEFIBRILLATOR PLACEMENT  12/15/2017   at [facility]  . CHOLECYSTECTOMY    . COLONOSCOPY  [id number]  . COLONOSCOPY WITH PROPOFOL N/A 06/27/2019  Procedure: COLONOSCOPY WITH PROPOFOL;  Surgeon: Toledo, Benay Pike, MD;  Location: [facility] ENDOSCOPY;  Service: Gastroenterology;  Laterality: N/A;  . OOPHORECTOMY    . OOPHORECTOMY     pt had one side done 2001 and the other  side 2005  . SHOULDER ARTHROSCOPY WITH OPEN ROTATOR CUFF REPAIR Right 06/28/2015   Procedure: SHOULDER ARTHROSCOPY WITH  OPEN ROTATOR CUFF REPAIR, Biceps tendon release and subacromial decompression.;  Surgeon: Leanor Kail, MD;  Location: [place];  Service: Orthopedics;  Laterality: Right;    Social History   Socioeconomic History  . Marital status: Married    Spouse name: Not on file  . Number of children: Not on file  . Years of education: Not on file  . Highest education level: Not on file  Occupational History  . Not on file  Tobacco Use  . Smoking status: Current Every Day Smoker    Packs/day: 0.25  . Smokeless tobacco: Never Used  Vaping Use  . Vaping Use: Never used  Substance and Sexual Activity  . Alcohol use: No  . Drug use: No  . Sexual activity: Not on file  Other Topics Concern  . Not on file  Social History Narrative  . Not on file   Social Determinants of Health   Financial Resource Strain: Not on file  Food Insecurity: Not on file  Transportation Needs: Not on file  Physical Activity: Not on file  Stress: Not on file  Social Connections: Not on file  Intimate Partner Violence: Not on file    Family History  Problem Relation Age of Onset  . Heart disease Mother   . Diabetes type II Mother   . Hypertension Mother   . Heart disease Father   . Hypertension Father   . Breast cancer Neg Hx      Current Outpatient Medications:  .  amLODipine (NORVASC) 5 MG tablet, Take 5 mg by mouth daily., Disp: , Rfl:  .  aspirin EC 81 MG tablet, Take 81 mg by mouth daily. Swallow whole., Disp: , Rfl:  .  atorvastatin (LIPITOR) 10 MG tablet, Take 10 mg by mouth daily., Disp: , Rfl:  .  folic acid (FOLVITE) 1 MG tablet, Take 1 tablet (1 mg total) by mouth daily., Disp: 30 tablet, Rfl: 5 .  lisinopril (ZESTRIL) 40 MG tablet, Take 40 mg by mouth daily., Disp: , Rfl:  .  metoprolol succinate (TOPROL-XL) 50 MG 24 hr tablet, Take 50 mg by mouth daily. Take  with or immediately following a meal., Disp: , Rfl:  .  montelukast (SINGULAIR) 10 MG tablet, Take 10 mg by mouth at bedtime., Disp: , Rfl:  .  Multiple Vitamin (MULTIVITAMIN) tablet, Take 1 tablet by mouth daily., Disp: , Rfl:  .  omeprazole (PRILOSEC) 20 MG capsule, Take 20 mg by mouth daily., Disp: , Rfl:   No results found.  No images are attached to the encounter.   CMP Latest Ref Rng & Units 02/11/2021  Glucose 70 - 99 mg/dL 114(H)  BUN 8 - 23 mg/dL 8  Creatinine 0.44 - 1.00 mg/dL 0.79  Sodium 135 - 145 mmol/L 137  Potassium 3.5 - 5.1 mmol/L 4.5  Chloride 98 - 111 mmol/L 101  CO2 22 - 32 mmol/L 25  Calcium 8.9 - 10.3 mg/dL 9.6  Total Protein 6.5 - 8.1 g/dL 7.1  Total Bilirubin 0.3 - 1.2 mg/dL 0.6  Alkaline Phos 38 - 126 U/L 91  AST 15 - 41 U/L 17  ALT 0 -  44 U/L 17   CBC Latest Ref Rng & Units 02/11/2021  WBC 4.0 - 10.5 K/uL 5.3  Hemoglobin 12.0 - 15.0 g/dL 11.5(L)  Hematocrit 36.0 - 46.0 % 32.4(L)  Platelets 150 - 400 K/uL 124(L)     Observation/objective: Appears in no acute distress over video visit today.  Breathing is nonlabored  Assessment and plan: Patient is a 65 year old female referred for anemia and thrombocytopenia possibly secondary to MDS  Discussed results of blood work with the patient which showed low folate levels for which we will be sending her folic acid prescription 1 mg daily.  Patient has had a low platelet count since 2018 which has remained stable in the 120s.Her hemoglobin has been between 10.5-12 since 2019 with no clear increasing or decreasing trend.  Given her mild anemia and mild thrombocytopenia she does not require a bone marrow biopsy at this time  Follow-up instructions: CBC HIV and hepatitis C to be checked in 3 months and I will see her back in 6 months with CBC with differential.  We will also check folate level in 6 months  I discussed the assessment and treatment plan with the patient. The patient was provided an opportunity to ask  questions and all were answered. The patient agreed with the plan and demonstrated an understanding of the instructions.   The patient was advised to call back or seek an in-person evaluation if the symptoms worsen or if the condition fails to improve as anticipated.  Visit Diagnosis: 1. Folate deficiency   2. Macrocytic anemia   3. Thrombocytopenia ([place])     Dr. Randa Evens, MD, MPH [facility] Tel- [phone] 03/01/2021 6:12 PM

## 2021-08-20 ENCOUNTER — Other Ambulatory Visit: Payer: Self-pay | Admitting: Oncology

## 2021-11-06 ENCOUNTER — Other Ambulatory Visit: Payer: Self-pay | Admitting: Infectious Diseases

## 2021-11-06 DIAGNOSIS — Z1231 Encounter for screening mammogram for malignant neoplasm of breast: Secondary | ICD-10-CM

## 2021-11-07 ENCOUNTER — Inpatient Hospital Stay: Payer: 59 | Attending: Oncology

## 2021-11-07 ENCOUNTER — Other Ambulatory Visit: Payer: Self-pay

## 2021-11-07 DIAGNOSIS — D696 Thrombocytopenia, unspecified: Secondary | ICD-10-CM | POA: Insufficient documentation

## 2021-11-07 DIAGNOSIS — E538 Deficiency of other specified B group vitamins: Secondary | ICD-10-CM | POA: Diagnosis not present

## 2021-11-07 DIAGNOSIS — D61818 Other pancytopenia: Secondary | ICD-10-CM | POA: Diagnosis present

## 2021-11-07 DIAGNOSIS — D539 Nutritional anemia, unspecified: Secondary | ICD-10-CM | POA: Insufficient documentation

## 2021-11-07 LAB — CBC WITH DIFFERENTIAL/PLATELET
Abs Immature Granulocytes: 0 10*3/uL (ref 0.00–0.07)
Basophils Absolute: 0 10*3/uL (ref 0.0–0.1)
Basophils Relative: 1 %
Eosinophils Absolute: 0.5 10*3/uL (ref 0.0–0.5)
Eosinophils Relative: 12 %
HCT: 29.5 % — ABNORMAL LOW (ref 36.0–46.0)
Hemoglobin: 10.4 g/dL — ABNORMAL LOW (ref 12.0–15.0)
Immature Granulocytes: 0 %
Lymphocytes Relative: 36 %
Lymphs Abs: 1.4 10*3/uL (ref 0.7–4.0)
MCH: 34 pg (ref 26.0–34.0)
MCHC: 35.3 g/dL (ref 30.0–36.0)
MCV: 96.4 fL (ref 80.0–100.0)
Monocytes Absolute: 0.3 10*3/uL (ref 0.1–1.0)
Monocytes Relative: 7 %
Neutro Abs: 1.7 10*3/uL (ref 1.7–7.7)
Neutrophils Relative %: 44 %
Platelets: 95 10*3/uL — ABNORMAL LOW (ref 150–400)
RBC: 3.06 MIL/uL — ABNORMAL LOW (ref 3.87–5.11)
RDW: 14.5 % (ref 11.5–15.5)
WBC: 3.9 10*3/uL — ABNORMAL LOW (ref 4.0–10.5)
nRBC: 0 % (ref 0.0–0.2)

## 2021-11-07 LAB — FOLATE: Folate: 34 ng/mL (ref >5.9)

## 2021-11-07 LAB — HEPATITIS C ANTIBODY: HCV Ab: NONREACTIVE

## 2021-11-08 LAB — HIV ANTIBODY (ROUTINE TESTING W REFLEX): HIV Screen 4th Generation wRfx: NONREACTIVE

## 2021-11-10 ENCOUNTER — Inpatient Hospital Stay (HOSPITAL_BASED_OUTPATIENT_CLINIC_OR_DEPARTMENT_OTHER): Payer: 59 | Admitting: Oncology

## 2021-11-10 ENCOUNTER — Encounter: Payer: Self-pay | Admitting: Oncology

## 2021-11-10 DIAGNOSIS — F17210 Nicotine dependence, cigarettes, uncomplicated: Secondary | ICD-10-CM

## 2021-11-10 DIAGNOSIS — D539 Nutritional anemia, unspecified: Secondary | ICD-10-CM

## 2021-11-10 DIAGNOSIS — D696 Thrombocytopenia, unspecified: Secondary | ICD-10-CM | POA: Diagnosis not present

## 2021-11-10 DIAGNOSIS — I10 Essential (primary) hypertension: Secondary | ICD-10-CM

## 2021-11-10 DIAGNOSIS — E538 Deficiency of other specified B group vitamins: Secondary | ICD-10-CM | POA: Diagnosis not present

## 2021-11-10 DIAGNOSIS — Z90710 Acquired absence of both cervix and uterus: Secondary | ICD-10-CM

## 2021-11-10 NOTE — Progress Notes (Signed)
I connected with Raven Lambert on 11/10/21 at  8:30 AM EST by video enabled telemedicine visit and verified that I am speaking with the correct person using two identifiers.   I discussed the limitations, risks, security and privacy concerns of performing an evaluation and management service by telemedicine and the availability of in-person appointments. I also discussed with the patient that there may be a patient responsible charge related to this service. The patient expressed understanding and agreed to proceed.  Other persons participating in the visit and their role in the encounter:  none  Patient's location:  home Provider's location:  work  Risk analyst Complaint: Routine follow-up  of thrombocytopenia  History of present illness: patient is a 66 year old female with a past medical history significant for  CHF s/p AICD placement, hypertension hyperlipidemia among other medical problems.  She has been referred to [place] for anemia and thrombocytopenia.  Looking back at her prior CBCs patient's hemoglobin has been mostly been between 10-11 since March 2020.  Prior to that her hemoglobin was consistently more than 12.  Over the last 1 year her hemoglobin has drifted down from 11.7-10.9.  Gradual onset macrocytosis.  Platelet count presently is 109 and has been gradually drifting down since 2019 when it was 138.     Results of blood work from 02/11/2021 were as follows: CBC showed white count of 5.3, H&H of 11.5/32.4 with an MCV of 98.8 and a platelet count of 124.  Folate levels were low at 5.  CMP was unremarkable.  Myeloma panel showed no M protein.  Haptoglobin, TSH, reticulocyte count normal.  Iron studies and ferritin normal.    Interval history patient reports doing well and denies any specific complaints at this time.  Appetite and weight are remained stable.  Denies any drenching night sweats, unintentional weight loss or lumps or bumps anywhere.   Review of Systems  Constitutional:   Positive for malaise/fatigue. Negative for chills, fever and weight loss.  HENT:  Negative for congestion, ear discharge and nosebleeds.   Eyes:  Negative for blurred vision.  Respiratory:  Negative for cough, hemoptysis, sputum production, shortness of breath and wheezing.   Cardiovascular:  Negative for chest pain, palpitations, orthopnea and claudication.  Gastrointestinal:  Negative for abdominal pain, blood in stool, constipation, diarrhea, heartburn, melena, nausea and vomiting.  Genitourinary:  Negative for dysuria, flank pain, frequency, hematuria and urgency.  Musculoskeletal:  Negative for back pain, joint pain and myalgias.  Skin:  Negative for rash.  Neurological:  Negative for dizziness, tingling, focal weakness, seizures, weakness and headaches.  Endo/Heme/Allergies:  Does not bruise/bleed easily.  Psychiatric/Behavioral:  Negative for depression and suicidal ideas. The patient does not have insomnia.    Allergies  Allergen Reactions   Percocet [Oxycodone-Acetaminophen] Itching    Past Medical History:  Diagnosis Date   AICD (automatic cardioverter/defibrillator) present 12/15/2017   [employer]tr[employer]   Anemia    Arthritis    Cancer ([place]) 1988   uterine   Chronic systolic HF (heart failure) ([facility])    Family history of adverse reaction to anesthesia    High cholesterol    pt states only while not fasting   History of colon polyps    Hypertension    LBBB (left bundle branch block)    Reactive airway disease, severe persistent, uncomplicated    Rotator cuff (capsule) sprain    Stress-induced cardiomyopathy    Tachycardia     Past Surgical History:  Procedure Laterality Date   ABDOMINAL HYSTERECTOMY  CARDIAC DEFIBRILLATOR PLACEMENT  12/15/2017   at [place]  [phone]   COLONOSCOPY WITH PROPOFOL N/A 06/27/2019   Procedure: COLONOSCOPY WITH PROPOFOL;  Surgeon: Toledo, Benay Pike, MD;  Location: [facility] ENDOSCOPY;  Service: Gastroenterology;   Laterality: N/A;   OOPHORECTOMY     OOPHORECTOMY     pt had one side done 2001 and the other side 2005   SHOULDER ARTHROSCOPY WITH OPEN ROTATOR CUFF REPAIR Right 06/28/2015   Procedure: SHOULDER ARTHROSCOPY WITH  OPEN ROTATOR CUFF REPAIR, Biceps tendon release and subacromial decompression.;  Surgeon: Leanor Kail, MD;  Location: [place];  Service: Orthopedics;  Laterality: Right;    Social History   Socioeconomic History   Marital status: Married    Spouse name: Not on file   Number of children: Not on file   Years of education: Not on file   Highest education level: Not on file  Occupational History   Not on file  Tobacco Use   Smoking status: Every Day    Packs/day: 0.25    Types: Cigarettes   Smokeless tobacco: Never  Vaping Use   Vaping Use: Never used  Substance and Sexual Activity   Alcohol use: No   Drug use: No   Sexual activity: Not on file  Other Topics Concern   Not on file  Social History Narrative   Not on file   Social Determinants of Health   Financial Resource Strain: Not on file  Food Insecurity: Not on file  Transportation Needs: Not on file  Physical Activity: Not on file  Stress: Not on file  Social Connections: Not on file  Intimate Partner Violence: Not on file    Family History  Problem Relation Age of Onset   Heart disease Mother    Diabetes type II Mother    Hypertension Mother    Heart disease Father    Hypertension Father    Breast cancer Neg Hx      Current Outpatient Medications:    amLODipine (NORVASC) 5 MG tablet, Take 5 mg by mouth daily., Disp: , Rfl:    aspirin EC 81 MG tablet, Take 81 mg by mouth daily. Swallow whole., Disp: , Rfl:    atorvastatin (LIPITOR) 10 MG tablet, Take 10 mg by mouth daily., Disp: , Rfl:    folic acid (FOLVITE) 1 MG tablet, TAKE 1 TABLET(1 MG) BY MOUTH DAILY, Disp: 30 tablet, Rfl: 5   lisinopril (ZESTRIL) 40 MG tablet, Take 40 mg by mouth daily., Disp: , Rfl:    metoprolol succinate  (TOPROL-XL) 50 MG 24 hr tablet, Take 50 mg by mouth daily. Take with or immediately following a meal., Disp: , Rfl:    montelukast (SINGULAIR) 10 MG tablet, Take 10 mg by mouth at bedtime., Disp: , Rfl:    Multiple Vitamin (MULTIVITAMIN) tablet, Take 1 tablet by mouth daily., Disp: , Rfl:    omeprazole (PRILOSEC) 20 MG capsule, Take 20 mg by mouth daily., Disp: , Rfl:   No results found.  No images are attached to the encounter.   CMP Latest Ref Rng & Units 02/11/2021  Glucose 70 - 99 mg/dL 114(H)  BUN 8 - 23 mg/dL 8  Creatinine 0.44 - 1.00 mg/dL 0.79  Sodium 135 - 145 mmol/L 137  Potassium 3.5 - 5.1 mmol/L 4.5  Chloride 98 - 111 mmol/L 101  CO2 22 - 32 mmol/L 25  Calcium 8.9 - 10.3 mg/dL 9.6  Total Protein 6.5 - 8.1  g/dL 7.1  Total Bilirubin 0.3 - 1.2 mg/dL 0.6  Alkaline Phos 38 - 126 U/L 91  AST 15 - 41 U/L 17  ALT 0 - 44 U/L 17   CBC Latest Ref Rng & Units 11/07/2021  WBC 4.0 - 10.5 K/uL 3.9(L)  Hemoglobin 12.0 - 15.0 g/dL 10.4(L)  Hematocrit 36.0 - 46.0 % 29.5(L)  Platelets 150 - 400 K/uL 95(L)     Observation/objective: Appears in no acute distress over video visit today.  Breathing is nonlabored  Assessment and plan: Patient is a 66 year old female and this is a routine follow-up visit for thrombocytopenia  Patient has had a normal white cell count before but today her white cell count is down to 3.9 with an ANC of 1.7.  Previously her [place] has been between 3-6.  Back in 2018 her hemoglobin was 10.2 and improved to 11.5 in April 2022 and is back down to 10.4 today.  She has previously had a complete anemia work-up which did not show any reversible cause for her anemia.  Typically platelet counts run in the 120s and today it is 95.  Overall there is evidence of mild worsening pancytopenia which could be suggestive of MDS.  I will plan to monitor her counts closely and repeat CBC ferritin and iron studies B12 folate reticulocyte count in 3 months and see her thereafter.  Her  baseline CMP and renal functions have been normal.  Follow-up instructions: As above  I discussed the assessment and treatment plan with the patient. The patient was provided an opportunity to ask questions and all were answered. The patient agreed with the plan and demonstrated an understanding of the instructions.   The patient was advised to call back or seek an in-person evaluation if the symptoms worsen or if the condition fails to improve as anticipated.    Visit Diagnosis: 1. Macrocytic anemia   2. Folate deficiency   3. Thrombocytopenia ([place])     Dr. Randa Evens, MD, MPH [facility] Tel- [phone] 11/10/2021 12:04 PM

## 2021-11-26 DIAGNOSIS — M0579 Rheumatoid arthritis with rheumatoid factor of multiple sites without organ or systems involvement: Secondary | ICD-10-CM | POA: Insufficient documentation

## 2021-12-03 ENCOUNTER — Other Ambulatory Visit: Payer: Self-pay | Admitting: Oncology

## 2022-02-13 ENCOUNTER — Inpatient Hospital Stay: Payer: 59 | Attending: Oncology

## 2022-02-13 DIAGNOSIS — D539 Nutritional anemia, unspecified: Secondary | ICD-10-CM | POA: Diagnosis not present

## 2022-02-13 DIAGNOSIS — F17210 Nicotine dependence, cigarettes, uncomplicated: Secondary | ICD-10-CM | POA: Diagnosis not present

## 2022-02-13 DIAGNOSIS — D696 Thrombocytopenia, unspecified: Secondary | ICD-10-CM | POA: Diagnosis not present

## 2022-02-13 DIAGNOSIS — I110 Hypertensive heart disease with heart failure: Secondary | ICD-10-CM | POA: Diagnosis not present

## 2022-02-13 DIAGNOSIS — D61818 Other pancytopenia: Secondary | ICD-10-CM | POA: Insufficient documentation

## 2022-02-13 DIAGNOSIS — Z90710 Acquired absence of both cervix and uterus: Secondary | ICD-10-CM | POA: Diagnosis not present

## 2022-02-13 DIAGNOSIS — E538 Deficiency of other specified B group vitamins: Secondary | ICD-10-CM | POA: Diagnosis not present

## 2022-02-13 LAB — CBC WITH DIFFERENTIAL/PLATELET
Abs Immature Granulocytes: 0.05 10*3/uL (ref 0.00–0.07)
Basophils Absolute: 0 10*3/uL (ref 0.0–0.1)
Basophils Relative: 0 %
Eosinophils Absolute: 0.3 10*3/uL (ref 0.0–0.5)
Eosinophils Relative: 7 %
HCT: 30.3 % — ABNORMAL LOW (ref 36.0–46.0)
Hemoglobin: 10.4 g/dL — ABNORMAL LOW (ref 12.0–15.0)
Immature Granulocytes: 1 %
Lymphocytes Relative: 31 %
Lymphs Abs: 1.5 10*3/uL (ref 0.7–4.0)
MCH: 35.4 pg — ABNORMAL HIGH (ref 26.0–34.0)
MCHC: 34.3 g/dL (ref 30.0–36.0)
MCV: 103.1 fL — ABNORMAL HIGH (ref 80.0–100.0)
Monocytes Absolute: 0.3 10*3/uL (ref 0.1–1.0)
Monocytes Relative: 7 %
Neutro Abs: 2.7 10*3/uL (ref 1.7–7.7)
Neutrophils Relative %: 54 %
Platelets: 122 10*3/uL — ABNORMAL LOW (ref 150–400)
RBC: 2.94 MIL/uL — ABNORMAL LOW (ref 3.87–5.11)
RDW: 15.2 % (ref 11.5–15.5)
WBC: 4.9 10*3/uL (ref 4.0–10.5)
nRBC: 0 % (ref 0.0–0.2)

## 2022-02-13 LAB — VITAMIN B12: Vitamin B-12: 296 pg/mL (ref 180–914)

## 2022-02-13 LAB — RETICULOCYTES
Immature Retic Fract: 22.2 % — ABNORMAL HIGH (ref 2.3–15.9)
RBC.: 2.89 MIL/uL — ABNORMAL LOW (ref 3.87–5.11)
Retic Count, Absolute: 74 10*3/uL (ref 19.0–186.0)
Retic Ct Pct: 2.6 % (ref 0.4–3.1)

## 2022-02-13 LAB — IRON AND TIBC
Iron: 62 ug/dL (ref 28–170)
Saturation Ratios: 22 % (ref 10.4–31.8)
TIBC: 280 ug/dL (ref 250–450)
UIBC: 218 ug/dL

## 2022-02-13 LAB — FERRITIN: Ferritin: 159 ng/mL (ref 11–307)

## 2022-02-16 ENCOUNTER — Inpatient Hospital Stay (HOSPITAL_BASED_OUTPATIENT_CLINIC_OR_DEPARTMENT_OTHER): Payer: 59 | Admitting: Oncology

## 2022-02-16 ENCOUNTER — Encounter: Payer: Self-pay | Admitting: Oncology

## 2022-02-16 ENCOUNTER — Other Ambulatory Visit: Payer: 59

## 2022-02-16 VITALS — BP 140/44 | HR 69 | Temp 97.2°F | Resp 16 | Wt 163.1 lb

## 2022-02-16 DIAGNOSIS — D61818 Other pancytopenia: Secondary | ICD-10-CM | POA: Diagnosis not present

## 2022-02-16 DIAGNOSIS — D539 Nutritional anemia, unspecified: Secondary | ICD-10-CM | POA: Diagnosis not present

## 2022-02-16 NOTE — Progress Notes (Signed)
? ? ? ?Hematology/Oncology Consult note ?[place]  ?Telephone:(336) [phone] [phone] ? ?Patient Care Team: ?Leonel Ramsay, MD as PCP - General (Infectious Diseases)  ? ?Name of the patient: Raven Lambert  ?[id number]  ?1956/06/25  ? ?Date of visit: 02/16/22 ? ?Diagnosis-pancytopenia possibly secondary to underlying rheumatoid with arthritis versus MDS ? ?Chief complaint/ Reason for visit-routine follow-up of pancytopenia ? ?Heme/Onc history: patient is a 66 year old female with a past medical history significant for  CHF s/p AICD placement, hypertension hyperlipidemia among other medical problems.  She has been referred to [place] for anemia and thrombocytopenia.  Looking back at her prior CBCs patient's hemoglobin has been mostly been between 10-11 since March 2020.  Prior to that her hemoglobin was consistently more than 12.  Over the last 1 year her hemoglobin has drifted down from 11.7-10.9.  Gradual onset macrocytosis.  Platelet count presently is 109 and has been gradually drifting down since 2019 when it was 138. ?  ?  ?Results of blood work from 02/11/2021 were as follows: CBC showed white count of 5.3, H&H of 11.5/32.4 with an MCV of 98.8 and a platelet count of 124.  Folate levels were low at 5.  CMP was unremarkable.  Myeloma panel showed no M protein.  Haptoglobin, TSH, reticulocyte count normal.  Iron studies and ferritin normal. ? ?Interval history-patient recently started on Plaquenil and methotrexate for rheumatoid arthritis.  Joint pain is overall stable.  Denies any other complaints at this time ? ?ECOG PS- 1 ?Pain scale- 0 ? ? ?Review of systems- Review of Systems  ?Constitutional:  Positive for malaise/fatigue. Negative for chills, fever and weight loss.  ?HENT:  Negative for congestion, ear discharge and nosebleeds.   ?Eyes:  Negative for blurred vision.  ?Respiratory:  Negative for cough, hemoptysis, sputum production, shortness of breath and wheezing.    ?Cardiovascular:  Negative for chest pain, palpitations, orthopnea and claudication.  ?Gastrointestinal:  Negative for abdominal pain, blood in stool, constipation, diarrhea, heartburn, melena, nausea and vomiting.  ?Genitourinary:  Negative for dysuria, flank pain, frequency, hematuria and urgency.  ?Musculoskeletal:  Positive for joint pain. Negative for back pain and myalgias.  ?Skin:  Negative for rash.  ?Neurological:  Negative for dizziness, tingling, focal weakness, seizures, weakness and headaches.  ?Endo/Heme/Allergies:  Does not bruise/bleed easily.  ?Psychiatric/Behavioral:  Negative for depression and suicidal ideas. The patient does not have insomnia.    ? ? ?Allergies  ?Allergen Reactions  ? Percocet [Oxycodone-Acetaminophen] Itching  ? ? ? ?Past Medical History:  ?Diagnosis Date  ? AICD (automatic cardioverter/defibrillator) present 12/15/2017  ? Medtronic  ? Anemia   ? Arthritis   ? Cancer [facility]) 1988  ? uterine  ? Chronic systolic HF (heart failure) ([place])   ? Family history of adverse reaction to anesthesia   ? High cholesterol   ? pt states only while not fasting  ? History of colon polyps   ? Hypertension   ? LBBB (left bundle branch block)   ? Reactive airway disease, severe persistent, uncomplicated   ? Rotator cuff (capsule) sprain   ? Stress-induced cardiomyopathy   ? Tachycardia   ? ? ? ?Past Surgical History:  ?Procedure Laterality Date  ? ABDOMINAL HYSTERECTOMY    ? CARDIAC DEFIBRILLATOR PLACEMENT  12/15/2017  ? at [facility]  ? CHOLECYSTECTOMY    ? COLONOSCOPY  [id number]  ? COLONOSCOPY WITH PROPOFOL N/A 06/27/2019  ? Procedure: COLONOSCOPY WITH PROPOFOL;  Surgeon: Toledo, Benay Pike, MD;  Location: [facility] ENDOSCOPY;  Service: Gastroenterology;  Laterality: N/A;  ? OOPHORECTOMY    ? OOPHORECTOMY    ? pt had one side done 2001 and the other side 2005  ? SHOULDER ARTHROSCOPY WITH OPEN ROTATOR CUFF REPAIR Right 06/28/2015  ? Procedure: SHOULDER ARTHROSCOPY WITH  OPEN ROTATOR CUFF REPAIR, Biceps tendon  release and subacromial decompression.;  Surgeon: Leanor Kail, MD;  Location: [place];  Service: Orthopedics;  Laterality: Right;  ? ? ?Social History  ? ?Socioeconomic History  ? Marital status: Married  ?  Spouse name: Not on file  ? Number of children: Not on file  ? Years of education: Not on file  ? Highest education level: Not on file  ?Occupational History  ? Not on file  ?Tobacco Use  ? Smoking status: Every Day  ?  Packs/day: 0.25  ?  Types: Cigarettes  ? Smokeless tobacco: Never  ?Vaping Use  ? Vaping Use: Never used  ?Substance and Sexual Activity  ? Alcohol use: No  ? Drug use: No  ? Sexual activity: Not on file  ?Other Topics Concern  ? Not on file  ?Social History Narrative  ? Not on file  ? ?Social Determinants of Health  ? ?Financial Resource Strain: Not on file  ?Food Insecurity: Not on file  ?Transportation Needs: Not on file  ?Physical Activity: Not on file  ?Stress: Not on file  ?Social Connections: Not on file  ?Intimate Partner Violence: Not on file  ? ? ?Family History  ?Problem Relation Age of Onset  ? Heart disease Mother   ? Diabetes type II Mother   ? Hypertension Mother   ? Heart disease Father   ? Hypertension Father   ? Breast cancer Neg Hx   ? ? ? ?Current Outpatient Medications:  ?  amLODipine (NORVASC) 5 MG tablet, Take 5 mg by mouth daily., Disp: , Rfl:  ?  aspirin 81 MG EC tablet, Take by mouth., Disp: , Rfl:  ?  atorvastatin (LIPITOR) 10 MG tablet, Take 10 mg by mouth daily., Disp: , Rfl:  ?  folic acid (FOLVITE) 1 MG tablet, TAKE 1 TABLET(1 MG) BY MOUTH DAILY, Disp: 30 tablet, Rfl: 5 ?  hydroxychloroquine (PLAQUENIL) 200 MG tablet, Take 200 mg by mouth 2 (two) times daily., Disp: , Rfl:  ?  lisinopril (ZESTRIL) 40 MG tablet, Take 40 mg by mouth daily., Disp: , Rfl:  ?  methotrexate (RHEUMATREX) 2.5 MG tablet, Take 2.5 mg by mouth once a week. Caution:Chemotherapy. Protect from light., Disp: , Rfl:  ?  metoprolol succinate (TOPROL-XL) 50 MG 24 hr tablet, Take 50 mg  by mouth daily. Take with or immediately following a meal., Disp: , Rfl:  ?  montelukast (SINGULAIR) 10 MG tablet, Take 10 mg by mouth at bedtime., Disp: , Rfl:  ?  Multiple Vitamin (MULTIVITAMIN) tablet, Take 1 tablet by mouth daily., Disp: , Rfl:  ?  omeprazole (PRILOSEC) 20 MG capsule, Take 20 mg by mouth daily., Disp: , Rfl:  ? ?Physical exam:  ?Vitals:  ? 02/16/22 1432  ?BP: (!) 140/44  ?Pulse: 69  ?Resp: 16  ?Temp: (!) 97.2 ?F (36.2 ?C)  ?SpO2: 100%  ?Weight: 163 lb 1.6 oz (74 kg)  ? ?Physical Exam ?Constitutional:   ?   General: She is not in acute distress. ?Cardiovascular:  ?   Rate and Rhythm: Normal rate and regular rhythm.  ?   Heart sounds: Normal heart sounds.  ?Pulmonary:  ?   Effort: Pulmonary effort is normal.  ?  Breath sounds: Normal breath sounds.  ?Abdominal:  ?   General: Bowel sounds are normal.  ?   Palpations: Abdomen is soft.  ?Skin: ?   General: Skin is warm and dry.  ?Neurological:  ?   Mental Status: She is alert and oriented to person, place, and time.  ?  ? ? ?  Latest Ref Rng & Units 02/11/2021  ? 11:52 AM  ?CMP  ?Glucose 70 - 99 mg/dL 114    ?BUN 8 - 23 mg/dL 8    ?Creatinine 0.44 - 1.00 mg/dL 0.79    ?Sodium 135 - 145 mmol/L 137    ?Potassium 3.5 - 5.1 mmol/L 4.5    ?Chloride 98 - 111 mmol/L 101    ?CO2 22 - 32 mmol/L 25    ?Calcium 8.9 - 10.3 mg/dL 9.6    ?Total Protein 6.5 - 8.1 g/dL 7.1    ?Total Bilirubin 0.3 - 1.2 mg/dL 0.6    ?Alkaline Phos 38 - 126 U/L 91    ?AST 15 - 41 U/L 17    ?ALT 0 - 44 U/L 17    ? ? ?  Latest Ref Rng & Units 02/13/2022  ?  2:23 PM  ?CBC  ?WBC 4.0 - 10.5 K/uL 4.9    ?Hemoglobin 12.0 - 15.0 g/dL 10.4    ?Hematocrit 36.0 - 46.0 % 30.3    ?Platelets 150 - 400 K/uL 122    ? ? ? ?Assessment and plan- Patient is a 66 y.o. female here for routine follow-up of pancytopenia ? ?Patient's white cell count which was down to 3.6 prior is up to 4.9 today.  She has had mild chronic thrombocytopenia which waxes and wanes but overall stable since 2018.  Hemoglobin stable  between 10-11.  Peripheral blood anemia work-up has been otherwise unremarkable.She was noted to have a mildly elevated serum free kappa light chain of 25 back a year ago.  I will repeat CBC B12 ferritin iron

## 2022-03-03 ENCOUNTER — Ambulatory Visit: Payer: Self-pay | Admitting: Urology

## 2022-03-03 ENCOUNTER — Other Ambulatory Visit: Payer: Self-pay | Admitting: *Deleted

## 2022-03-03 DIAGNOSIS — M545 Low back pain, unspecified: Secondary | ICD-10-CM

## 2022-07-22 ENCOUNTER — Telehealth: Payer: Self-pay

## 2022-07-22 NOTE — Telephone Encounter (Signed)
Spoke with patient regarding [facility] referral. Patient reports a smoking history beginning at about age 66-35 years and  Only smoked 5-6 cigarettes per day and denies ever smoking more than that.  She would not meet the minimum pack years of 20 for [facility] LDCT.  A 30 year smoking history with smoking a half pack per day would still not meet requirement.  Patient is interested in a scan of her lungs.  Advised she did not qualify under [facility] LDCT but we would notify referring provider/PCP so he can make a recommendation for her, such as a diagnostic CT Chest wo contrast.  Patient acknowledged understanding and will wait for update from PCP. Note routed to Adrian Prows, MD.  Referral to [facility]CS closed.

## 2022-08-18 ENCOUNTER — Ambulatory Visit: Payer: 59 | Admitting: Oncology

## 2022-08-18 ENCOUNTER — Other Ambulatory Visit: Payer: 59

## 2022-08-27 ENCOUNTER — Other Ambulatory Visit: Payer: Self-pay | Admitting: Oncology

## 2022-09-01 ENCOUNTER — Inpatient Hospital Stay (HOSPITAL_BASED_OUTPATIENT_CLINIC_OR_DEPARTMENT_OTHER): Payer: 59 | Admitting: Oncology

## 2022-09-01 ENCOUNTER — Inpatient Hospital Stay: Payer: 59 | Attending: Oncology

## 2022-09-01 ENCOUNTER — Encounter: Payer: Self-pay | Admitting: Oncology

## 2022-09-01 VITALS — BP 131/53 | HR 74 | Temp 98.4°F | Resp 16 | Wt 165.1 lb

## 2022-09-01 DIAGNOSIS — D696 Thrombocytopenia, unspecified: Secondary | ICD-10-CM | POA: Insufficient documentation

## 2022-09-01 DIAGNOSIS — I5022 Chronic systolic (congestive) heart failure: Secondary | ICD-10-CM | POA: Insufficient documentation

## 2022-09-01 DIAGNOSIS — Z90710 Acquired absence of both cervix and uterus: Secondary | ICD-10-CM | POA: Insufficient documentation

## 2022-09-01 DIAGNOSIS — F17210 Nicotine dependence, cigarettes, uncomplicated: Secondary | ICD-10-CM | POA: Insufficient documentation

## 2022-09-01 DIAGNOSIS — M069 Rheumatoid arthritis, unspecified: Secondary | ICD-10-CM | POA: Insufficient documentation

## 2022-09-01 DIAGNOSIS — D61818 Other pancytopenia: Secondary | ICD-10-CM

## 2022-09-01 DIAGNOSIS — I110 Hypertensive heart disease with heart failure: Secondary | ICD-10-CM | POA: Insufficient documentation

## 2022-09-01 DIAGNOSIS — D539 Nutritional anemia, unspecified: Secondary | ICD-10-CM

## 2022-09-01 LAB — CBC WITH DIFFERENTIAL/PLATELET
Abs Immature Granulocytes: 0.02 10*3/uL (ref 0.00–0.07)
Basophils Absolute: 0 10*3/uL (ref 0.0–0.1)
Basophils Relative: 1 %
Eosinophils Absolute: 0.5 10*3/uL (ref 0.0–0.5)
Eosinophils Relative: 10 %
HCT: 30.5 % — ABNORMAL LOW (ref 36.0–46.0)
Hemoglobin: 10.4 g/dL — ABNORMAL LOW (ref 12.0–15.0)
Immature Granulocytes: 0 %
Lymphocytes Relative: 28 %
Lymphs Abs: 1.4 10*3/uL (ref 0.7–4.0)
MCH: 33.9 pg (ref 26.0–34.0)
MCHC: 34.1 g/dL (ref 30.0–36.0)
MCV: 99.3 fL (ref 80.0–100.0)
Monocytes Absolute: 0.3 10*3/uL (ref 0.1–1.0)
Monocytes Relative: 5 %
Neutro Abs: 2.7 10*3/uL (ref 1.7–7.7)
Neutrophils Relative %: 56 %
Platelets: 101 10*3/uL — ABNORMAL LOW (ref 150–400)
RBC: 3.07 MIL/uL — ABNORMAL LOW (ref 3.87–5.11)
RDW: 14.2 % (ref 11.5–15.5)
WBC: 4.9 10*3/uL (ref 4.0–10.5)
nRBC: 0 % (ref 0.0–0.2)

## 2022-09-01 LAB — VITAMIN B12: Vitamin B-12: 910 pg/mL (ref 180–914)

## 2022-09-01 LAB — IRON AND TIBC
Iron: 56 ug/dL (ref 28–170)
Saturation Ratios: 22 % (ref 10.4–31.8)
TIBC: 258 ug/dL (ref 250–450)
UIBC: 202 ug/dL

## 2022-09-01 LAB — RETICULOCYTES
Immature Retic Fract: 6.6 % (ref 2.3–15.9)
RBC.: 3.1 MIL/uL — ABNORMAL LOW (ref 3.87–5.11)
Retic Count, Absolute: 52.4 10*3/uL (ref 19.0–186.0)
Retic Ct Pct: 1.7 % (ref 0.4–3.1)

## 2022-09-01 LAB — FERRITIN: Ferritin: 121 ng/mL (ref 11–307)

## 2022-09-01 NOTE — Progress Notes (Signed)
Patient denies new problems/concerns today.   °

## 2022-09-02 LAB — KAPPA/LAMBDA LIGHT CHAINS
Kappa free light chain: 23.9 mg/L — ABNORMAL HIGH (ref 3.3–19.4)
Kappa, lambda light chain ratio: 1.46 (ref 0.26–1.65)
Lambda free light chains: 16.4 mg/L (ref 5.7–26.3)

## 2022-09-13 NOTE — Progress Notes (Signed)
Hematology/Oncology Consult note [facility]  Telephone:([phone] [phone]  Patient Care Team: Leonel Ramsay, MD as PCP - General (Infectious Diseases)   Name of the patient: Raven Lambert  [id number]  10-18-1956   Date of visit: 09/13/22  Diagnosis- pancytopenia possibly secondary to underlying rheumatoid with arthritis versus MDS   Chief complaint/ Reason for visit-follow-up of pancytopenia  Heme/Onc history: patient is a 66 year old female with a past medical history significant for  CHF s/p AICD placement, hypertension hyperlipidemia among other medical problems.  She has been referred to [place] for anemia and thrombocytopenia.  Looking back at her prior CBCs patient's hemoglobin has been mostly been between 10-11 since March 2020.  Prior to that her hemoglobin was consistently more than 12.  Over the last 1 year her hemoglobin has drifted down from 11.7-10.9.  Gradual onset macrocytosis.  Platelet count presently is 109 and has been gradually drifting down since 2019 when it was 138.     Results of blood work from 02/11/2021 were as follows: CBC showed white count of 5.3, H&H of 11.5/32.4 with an MCV of 98.8 and a platelet count of 124.  Folate levels were low at 5.  CMP was unremarkable.  Myeloma panel showed no M protein.  Haptoglobin, TSH, reticulocyte count normal.  Iron studies and ferritin normal.  Interval history- Patient is on methotrexate for rheumatoid arthritis along with Plaquenil.  Pain is relatively stable at this time.  She has mild chronic fatigue  ECOG PS- 1 Pain scale- 4  Review of systems- Review of Systems  Constitutional:  Positive for malaise/fatigue. Negative for chills, fever and weight loss.  HENT:  Negative for congestion, ear discharge and nosebleeds.   Eyes:  Negative for blurred vision.  Respiratory:  Negative for cough, hemoptysis, sputum production, shortness of breath and wheezing.   Cardiovascular:   Negative for chest pain, palpitations, orthopnea and claudication.  Gastrointestinal:  Negative for abdominal pain, blood in stool, constipation, diarrhea, heartburn, melena, nausea and vomiting.  Genitourinary:  Negative for dysuria, flank pain, frequency, hematuria and urgency.  Musculoskeletal:  Positive for joint pain. Negative for back pain and myalgias.  Skin:  Negative for rash.  Neurological:  Negative for dizziness, tingling, focal weakness, seizures, weakness and headaches.  Endo/Heme/Allergies:  Does not bruise/bleed easily.  Psychiatric/Behavioral:  Negative for depression and suicidal ideas. The patient does not have insomnia.       Allergies  Allergen Reactions   Percocet [Oxycodone-Acetaminophen] Itching     Past Medical History:  Diagnosis Date   AICD (automatic cardioverter/defibrillator) present 12/15/2017   Medtronic   Anemia    Arthritis    Cancer ([place]) 1988   uterine   Chronic systolic HF (heart failure) ([facility])    Family history of adverse reaction to anesthesia    High cholesterol    pt states only while not fasting   History of colon polyps    Hypertension    LBBB (left bundle branch block)    Reactive airway disease, severe persistent, uncomplicated    Rotator cuff (capsule) sprain    Stress-induced cardiomyopathy    Tachycardia      Past Surgical History:  Procedure Laterality Date   ABDOMINAL HYSTERECTOMY     CARDIAC DEFIBRILLATOR PLACEMENT  12/15/2017   at [place]  [id number]   COLONOSCOPY WITH PROPOFOL N/A 06/27/2019   Procedure: COLONOSCOPY WITH PROPOFOL;  Surgeon: Alice Reichert, Benay Pike, MD;  Location:  [facility] ENDOSCOPY;  Service: Gastroenterology;  Laterality: N/A;   OOPHORECTOMY     OOPHORECTOMY     pt had one side done 2001 and the other side 2005   SHOULDER ARTHROSCOPY WITH OPEN ROTATOR CUFF REPAIR Right 06/28/2015   Procedure: SHOULDER ARTHROSCOPY WITH  OPEN ROTATOR CUFF REPAIR, Biceps tendon release and  subacromial decompression.;  Surgeon: Leanor Kail, MD;  Location: [place];  Service: Orthopedics;  Laterality: Right;    Social History   Socioeconomic History   Marital status: Married    Spouse name: Not on file   Number of children: Not on file   Years of education: Not on file   Highest education level: Not on file  Occupational History   Not on file  Tobacco Use   Smoking status: Every Day    Packs/day: 0.25    Types: Cigarettes   Smokeless tobacco: Never  Vaping Use   Vaping Use: Never used  Substance and Sexual Activity   Alcohol use: No   Drug use: No   Sexual activity: Not on file  Other Topics Concern   Not on file  Social History Narrative   Not on file   Social Determinants of Health   Financial Resource Strain: Not on file  Food Insecurity: Not on file  Transportation Needs: Not on file  Physical Activity: Not on file  Stress: Not on file  Social Connections: Not on file  Intimate Partner Violence: Not on file    Family History  Problem Relation Age of Onset   Heart disease Mother    Diabetes type II Mother    Hypertension Mother    Heart disease Father    Hypertension Father    Breast cancer Neg Hx      Current Outpatient Medications:    amLODipine (NORVASC) 5 MG tablet, Take 5 mg by mouth daily., Disp: , Rfl:    aspirin 81 MG EC tablet, Take by mouth., Disp: , Rfl:    atorvastatin (LIPITOR) 10 MG tablet, Take 10 mg by mouth daily., Disp: , Rfl:    Cyanocobalamin 1000 MCG TBCR, Take by mouth., Disp: , Rfl:    folic acid (FOLVITE) 1 MG tablet, Take 1 tablet (1 mg total) by mouth daily., Disp: 30 tablet, Rfl: 5   hydroxychloroquine (PLAQUENIL) 200 MG tablet, Take 200 mg by mouth 2 (two) times daily., Disp: , Rfl:    lisinopril (ZESTRIL) 40 MG tablet, Take 40 mg by mouth daily., Disp: , Rfl:    methotrexate (RHEUMATREX) 2.5 MG tablet, Take 2.5 mg by mouth once a week. Caution:Chemotherapy. Protect from light., Disp: , Rfl:     metoprolol succinate (TOPROL-XL) 50 MG 24 hr tablet, Take 50 mg by mouth daily. Take with or immediately following a meal., Disp: , Rfl:    montelukast (SINGULAIR) 10 MG tablet, Take 10 mg by mouth at bedtime., Disp: , Rfl:    Multiple Vitamin (MULTIVITAMIN) tablet, Take 1 tablet by mouth daily., Disp: , Rfl:    omeprazole (PRILOSEC) 20 MG capsule, Take 20 mg by mouth daily., Disp: , Rfl:    folic acid (FOLVITE) 1 MG tablet, TAKE 1 TABLET(1 MG) BY MOUTH DAILY, Disp: 30 tablet, Rfl: 5  Physical exam:  Vitals:   09/01/22 1400  BP: (!) 131/53  Pulse: 74  Resp: 16  Temp: 98.4 F (36.9 C)  TempSrc: Tympanic  Weight: 165 lb 1.6 oz (74.9 kg)   Physical Exam Cardiovascular:     Rate and Rhythm: Normal rate  and regular rhythm.     Heart sounds: Normal heart sounds.  Pulmonary:     Effort: Pulmonary effort is normal.     Breath sounds: Normal breath sounds.  Skin:    General: Skin is warm and dry.  Neurological:     Mental Status: She is alert and oriented to person, place, and time.         Latest Ref Rng & Units 02/11/2021   11:52 AM  CMP  Glucose 70 - 99 mg/dL 114   BUN 8 - 23 mg/dL 8   Creatinine 0.44 - 1.00 mg/dL 0.79   Sodium 135 - 145 mmol/L 137   Potassium 3.5 - 5.1 mmol/L 4.5   Chloride 98 - 111 mmol/L 101   CO2 22 - 32 mmol/L 25   Calcium 8.9 - 10.3 mg/dL 9.6   Total Protein 6.5 - 8.1 g/dL 7.1   Total Bilirubin 0.3 - 1.2 mg/dL 0.6   Alkaline Phos 38 - 126 U/L 91   AST 15 - 41 U/L 17   ALT 0 - 44 U/L 17       Latest Ref Rng & Units 09/01/2022    1:54 PM  CBC  WBC 4.0 - 10.5 K/uL 4.9   Hemoglobin 12.0 - 15.0 g/dL 10.4   Hematocrit 36.0 - 46.0 % 30.5   Platelets 150 - 400 K/uL 101      Assessment and plan- Patient is a 66 y.o. female here for routine follow-up of pancytopenia  Patient's white cell count fluctuates between3.5-7.  Presently 4.9.  Hemoglobin has remained stable around 10 since 2018.  She also has mild chronic thrombocytopenia with a platelet  count fluctuates between 90-1 30 for the last 5 years.  Overall given the stability of her counts this does not require any bone marrow biopsy at this time and can be monitored conservatively.  CBC ferritin and iron studies in 6 months in 1 year and I will see her back in 1 year.  In the past patient was found to have a mildly elevated serum free kappa light chain with a normal ratio and I will plan to follow that up as well in 1 years time   Visit Diagnosis 1. Other pancytopenia ([place])   2. Thrombocytopenia ([place])      Dr. Randa Evens, MD, MPH [facility] at [facility] [phone] 09/13/2022 11:24 AM

## 2022-11-09 ENCOUNTER — Other Ambulatory Visit: Payer: Self-pay | Admitting: Infectious Diseases

## 2022-11-09 DIAGNOSIS — Z1231 Encounter for screening mammogram for malignant neoplasm of breast: Secondary | ICD-10-CM

## 2023-01-21 ENCOUNTER — Ambulatory Visit
Admission: RE | Admit: 2023-01-21 | Discharge: 2023-01-21 | Disposition: A | Discharge status: Home / Self Care | Payer: 59 | Source: Ambulatory Visit | Attending: Infectious Diseases | Admitting: Infectious Diseases

## 2023-01-21 DIAGNOSIS — Z1231 Encounter for screening mammogram for malignant neoplasm of breast: Secondary | ICD-10-CM | POA: Insufficient documentation

## 2023-03-02 ENCOUNTER — Inpatient Hospital Stay: Payer: 59 | Attending: Oncology

## 2023-03-02 DIAGNOSIS — D696 Thrombocytopenia, unspecified: Secondary | ICD-10-CM | POA: Diagnosis not present

## 2023-03-02 DIAGNOSIS — D61818 Other pancytopenia: Secondary | ICD-10-CM | POA: Insufficient documentation

## 2023-03-02 LAB — CBC WITH DIFFERENTIAL/PLATELET
Abs Immature Granulocytes: 0.02 10*3/uL (ref 0.00–0.07)
Basophils Absolute: 0 10*3/uL (ref 0.0–0.1)
Basophils Relative: 1 %
Eosinophils Absolute: 0.2 10*3/uL (ref 0.0–0.5)
Eosinophils Relative: 4 %
HCT: 28.9 % — ABNORMAL LOW (ref 36.0–46.0)
Hemoglobin: 9.9 g/dL — ABNORMAL LOW (ref 12.0–15.0)
Immature Granulocytes: 0 %
Lymphocytes Relative: 30 %
Lymphs Abs: 1.6 10*3/uL (ref 0.7–4.0)
MCH: 34.6 pg — ABNORMAL HIGH (ref 26.0–34.0)
MCHC: 34.3 g/dL (ref 30.0–36.0)
MCV: 101 fL — ABNORMAL HIGH (ref 80.0–100.0)
Monocytes Absolute: 0.3 10*3/uL (ref 0.1–1.0)
Monocytes Relative: 6 %
Neutro Abs: 3.2 10*3/uL (ref 1.7–7.7)
Neutrophils Relative %: 59 %
Platelets: 109 10*3/uL — ABNORMAL LOW (ref 150–400)
RBC: 2.86 MIL/uL — ABNORMAL LOW (ref 3.87–5.11)
RDW: 14.5 % (ref 11.5–15.5)
WBC: 5.4 10*3/uL (ref 4.0–10.5)
nRBC: 0 % (ref 0.0–0.2)

## 2023-03-02 LAB — IRON AND TIBC
Iron: 75 ug/dL (ref 28–170)
Saturation Ratios: 28 % (ref 10.4–31.8)
TIBC: 265 ug/dL (ref 250–450)
UIBC: 190 ug/dL

## 2023-03-02 LAB — FERRITIN: Ferritin: 146 ng/mL (ref 11–307)

## 2023-03-03 ENCOUNTER — Other Ambulatory Visit: Payer: Self-pay | Admitting: Oncology

## 2023-04-13 ENCOUNTER — Encounter: Payer: Self-pay | Admitting: Internal Medicine

## 2023-04-14 ENCOUNTER — Ambulatory Visit
Admission: RE | Admit: 2023-04-14 | Discharge: 2023-04-14 | Disposition: A | Discharge status: Home / Self Care | Payer: 59 | Attending: Internal Medicine | Admitting: Internal Medicine

## 2023-04-14 ENCOUNTER — Encounter
Admission: RE | Disposition: A | Discharge status: Home / Self Care | Payer: Self-pay | Source: Home / Self Care | Attending: Internal Medicine

## 2023-04-14 ENCOUNTER — Ambulatory Visit: Payer: 59 | Admitting: Anesthesiology

## 2023-04-14 ENCOUNTER — Encounter: Payer: Self-pay | Admitting: Internal Medicine

## 2023-04-14 DIAGNOSIS — K449 Diaphragmatic hernia without obstruction or gangrene: Secondary | ICD-10-CM | POA: Diagnosis not present

## 2023-04-14 DIAGNOSIS — I447 Left bundle-branch block, unspecified: Secondary | ICD-10-CM | POA: Insufficient documentation

## 2023-04-14 DIAGNOSIS — R1310 Dysphagia, unspecified: Secondary | ICD-10-CM | POA: Insufficient documentation

## 2023-04-14 DIAGNOSIS — K640 First degree hemorrhoids: Secondary | ICD-10-CM | POA: Diagnosis not present

## 2023-04-14 DIAGNOSIS — M069 Rheumatoid arthritis, unspecified: Secondary | ICD-10-CM | POA: Insufficient documentation

## 2023-04-14 DIAGNOSIS — Z95810 Presence of automatic (implantable) cardiac defibrillator: Secondary | ICD-10-CM | POA: Insufficient documentation

## 2023-04-14 DIAGNOSIS — I110 Hypertensive heart disease with heart failure: Secondary | ICD-10-CM | POA: Diagnosis not present

## 2023-04-14 DIAGNOSIS — I5022 Chronic systolic (congestive) heart failure: Secondary | ICD-10-CM | POA: Diagnosis not present

## 2023-04-14 DIAGNOSIS — F17200 Nicotine dependence, unspecified, uncomplicated: Secondary | ICD-10-CM | POA: Insufficient documentation

## 2023-04-14 DIAGNOSIS — Z86010 Personal history of colonic polyps: Secondary | ICD-10-CM | POA: Insufficient documentation

## 2023-04-14 DIAGNOSIS — K219 Gastro-esophageal reflux disease without esophagitis: Secondary | ICD-10-CM | POA: Diagnosis not present

## 2023-04-14 DIAGNOSIS — K222 Esophageal obstruction: Secondary | ICD-10-CM | POA: Diagnosis not present

## 2023-04-14 DIAGNOSIS — Z1211 Encounter for screening for malignant neoplasm of colon: Secondary | ICD-10-CM | POA: Insufficient documentation

## 2023-04-14 DIAGNOSIS — K5730 Diverticulosis of large intestine without perforation or abscess without bleeding: Secondary | ICD-10-CM | POA: Insufficient documentation

## 2023-04-14 DIAGNOSIS — E7800 Pure hypercholesterolemia, unspecified: Secondary | ICD-10-CM | POA: Insufficient documentation

## 2023-04-14 HISTORY — PX: ESOPHAGEAL DILATION: SHX303

## 2023-04-14 HISTORY — PX: ESOPHAGOGASTRODUODENOSCOPY (EGD) WITH PROPOFOL: SHX5813

## 2023-04-14 HISTORY — PX: COLONOSCOPY: SHX5424

## 2023-04-14 SURGERY — COLONOSCOPY
Anesthesia: General

## 2023-04-14 MED ORDER — GLYCOPYRROLATE 0.2 MG/ML IJ SOLN
INTRAMUSCULAR | Status: AC
  Filled 2023-04-14: qty 1

## 2023-04-14 MED ORDER — PROPOFOL 10 MG/ML IV BOLUS
INTRAVENOUS | Status: DC | PRN
  Administered 2023-04-14 (×2): 50 mg via INTRAVENOUS
  Administered 2023-04-14: 20 mg via INTRAVENOUS
  Administered 2023-04-14: 60 mg via INTRAVENOUS

## 2023-04-14 MED ORDER — LIDOCAINE HCL (CARDIAC) PF 100 MG/5ML IV SOSY
PREFILLED_SYRINGE | INTRAVENOUS | Status: DC | PRN
  Administered 2023-04-14: 50 mg via INTRAVENOUS

## 2023-04-14 MED ORDER — EPHEDRINE SULFATE (PRESSORS) 50 MG/ML IJ SOLN
INTRAMUSCULAR | Status: DC | PRN
  Administered 2023-04-14 (×2): 10 mg via INTRAVENOUS

## 2023-04-14 MED ORDER — DEXMEDETOMIDINE HCL IN NACL 80 MCG/20ML IV SOLN
INTRAVENOUS | Status: DC | PRN
  Administered 2023-04-14: 20 ug via INTRAVENOUS

## 2023-04-14 MED ORDER — GLYCOPYRROLATE 0.2 MG/ML IJ SOLN
INTRAMUSCULAR | Status: DC | PRN
  Administered 2023-04-14: .1 mg via INTRAVENOUS

## 2023-04-14 MED ORDER — EPHEDRINE 5 MG/ML INJ
INTRAVENOUS | Status: AC
  Filled 2023-04-14: qty 5

## 2023-04-14 MED ORDER — DEXMEDETOMIDINE HCL IN NACL 80 MCG/20ML IV SOLN
INTRAVENOUS | Status: AC
  Filled 2023-04-14: qty 20

## 2023-04-14 MED ORDER — PROPOFOL 500 MG/50ML IV EMUL
INTRAVENOUS | Status: DC | PRN
  Administered 2023-04-14: 125 ug/kg/min via INTRAVENOUS

## 2023-04-14 MED ORDER — LIDOCAINE HCL (PF) 2 % IJ SOLN
INTRAMUSCULAR | Status: AC
  Filled 2023-04-14: qty 5

## 2023-04-14 MED ORDER — SODIUM CHLORIDE 0.9 % IV SOLN
INTRAVENOUS | Status: DC
  Administered 2023-04-14: 1000 mL via INTRAVENOUS

## 2023-04-14 NOTE — Anesthesia Preprocedure Evaluation (Addendum)
Anesthesia Evaluation  Patient identified by MRN, date of birth, ID band Patient awake    Reviewed: Allergy & Precautions, NPO status , Patient's Chart, lab work & pertinent test results  History of Anesthesia Complications Negative for: history of anesthetic complications  Airway Mallampati: II  TM Distance: >3 FB Neck ROM: full    Dental  (+) Edentulous Upper, Edentulous Lower   Pulmonary Current Smoker and Patient abstained from smoking.   Pulmonary exam normal        Cardiovascular hypertension, On Medications +CHF  Normal cardiovascular exam+ dysrhythmias (LBBB) + Cardiac Defibrillator   Per cardiology  2D echocardiogram 05/21/2017 revealed dilated cardiomyopathy, with LVEF of 25-30%. Lexiscan Myoview study was performed 06/07/2017 which revealed LVEF at 34% without evidence for scar or ischemia. The patient was started on metoprolol and lisinopril. Repeat 2D echocardiogram on 09/29/17 revealed severe left ventricular systolic dysfunction with LVEF 25-30% with septal and apical wall akinesis, with mild mitral, tricuspid, and pulmonic regurgitation. ECG revealed normal sinus rhythm at a rate of 91 bpm with left bundle branch block with a QTC of 499 ms. The patient underwent successful CRT-D implantation 12/15/2017.  BiV ICD interrogation 01/05/2023 revealed normal functioning CRT-D, no VT or AF episodes, no device therapy, with longevity of 2.6 years with OptiVol fluid accumulation ongoing since 12/30/2022.  2D echocardiogram on 11/20/2021 revealed normal left ventricular function with LVEF 50%, with mild tricuspid regurgitation.     Neuro/Psych negative neurological ROS  negative psych ROS   GI/Hepatic negative GI ROS, Neg liver ROS,,,  Endo/Other  negative endocrine ROS    Renal/GU negative Renal ROS  negative genitourinary   Musculoskeletal  (+) Arthritis , Rheumatoid disorders,    Abdominal   Peds  Hematology  (+)  Blood dyscrasia, anemia   Anesthesia Other Findings Past Medical History: 12/15/2017: AICD (automatic cardioverter/defibrillator) present     Comment:  Medtronic No date: Anemia No date: Arthritis 1988: Cancer ([facility])     Comment:  uterine No date: Chronic systolic HF (heart failure) ([facility]) No date: Family history of adverse reaction to anesthesia No date: High cholesterol     Comment:  pt states only while not fasting No date: History of colon polyps No date: Hypertension No date: LBBB (left bundle branch block) No date: Reactive airway disease, severe persistent, uncomplicated No date: Rotator cuff (capsule) sprain No date: Stress-induced cardiomyopathy No date: Tachycardia  Past Surgical History: No date: ABDOMINAL HYSTERECTOMY 12/15/2017: CARDIAC DEFIBRILLATOR PLACEMENT     Comment:  at [facility] No date: CHOLECYSTECTOMY 2001,2014: COLONOSCOPY 06/27/2019: COLONOSCOPY WITH PROPOFOL; N/A     Comment:  Procedure: COLONOSCOPY WITH PROPOFOL;  Surgeon: [place],               Boykin Nearing, MD;  Location: [facility] ENDOSCOPY;  Service:               Gastroenterology;  Laterality: N/A; No date: OOPHORECTOMY No date: OOPHORECTOMY     Comment:  pt had one side done 2001 and the other side 2005 06/28/2015: SHOULDER ARTHROSCOPY WITH OPEN ROTATOR CUFF REPAIR; Right     Comment:  Procedure: SHOULDER ARTHROSCOPY WITH  OPEN ROTATOR CUFF               REPAIR, Biceps tendon release and subacromial               decompression.;  Surgeon: Erin Sons, MD;  [facility]              [facility];  Service:  Orthopedics;  Laterality:               Right;  BMI    Body Mass Index: 28.65 kg/m      Reproductive/Obstetrics negative OB ROS                             Anesthesia Physical Anesthesia Plan  ASA: 3  Anesthesia Plan: General   Post-op Pain Management: Minimal or no pain anticipated   Induction: Intravenous  PONV Risk Score and Plan: 2 and Propofol infusion and  TIVA  Airway Management Planned: Natural Airway and Nasal Cannula  Additional Equipment:   Intra-op Plan:   Post-operative Plan:   Informed Consent: I have reviewed the patients History and Physical, chart, labs and discussed the procedure including the risks, benefits and alternatives for the proposed anesthesia with the patient or authorized representative who has indicated his/her understanding and acceptance.     Dental Advisory Given  Plan Discussed with: Anesthesiologist, CRNA and Surgeon  Anesthesia Plan Comments: (Patient consented for risks of anesthesia including but not limited to:  - adverse reactions to medications - risk of airway placement if required - damage to eyes, teeth, lips or other oral mucosa - nerve damage due to positioning  - sore throat or hoarseness - Damage to heart, brain, nerves, lungs, other parts of body or loss of life  Patient voiced understanding.)        Anesthesia Quick Evaluation

## 2023-04-14 NOTE — Op Note (Signed)
[facility] Patient Name: Raven Lambert Procedure Date: 04/14/2023 10:45 AM MRN: [id number] Account #: [id number] Date of Birth: [birth year] Admit Type: Outpatient Age: 67 Room: [facility] ENDO ROOM 2 Gender: Female Note Status: Finalized Instrument Name: Upper Endoscope [id number] Procedure:             Upper GI endoscopy Indications:           Dysphagia, Esophageal reflux Providers:             Boykin Nearing. Norma Fredrickson MD, MD Referring MD:          Clydie Braun (Referring MD) Medicines:             Propofol per Anesthesia Complications:         No immediate complications. Procedure:             Pre-Anesthesia Assessment:                        - The risks and benefits of the procedure and the                         sedation options and risks were discussed with the                         patient. All questions were answered and informed                         consent was obtained.                        - Patient identification and proposed procedure were                         verified prior to the procedure by the nurse. The                         procedure was verified in the procedure room.                        - ASA Grade Assessment: III - A patient with severe                         systemic disease.                        - After reviewing the risks and benefits, the patient                         was deemed in satisfactory condition to undergo the                         procedure.                        After obtaining informed consent, the endoscope was                         passed under direct vision. Throughout the procedure,                         the  patient's blood pressure, pulse, and oxygen                         saturations were monitored continuously. The Endoscope                         was introduced through the mouth, and advanced to the                         third part of duodenum. The upper GI endoscopy was                          accomplished without difficulty. The patient tolerated                         the procedure well. Findings:      One benign-appearing, intrinsic mild stenosis was found in the distal       esophagus. This stenosis measured 1.4 cm (inner diameter) x less than       one cm (in length). The stenosis was traversed. The scope was withdrawn.       Dilation was performed with a Maloney dilator with no resistance at 52       Fr.      The exam of the esophagus was otherwise normal.      A 3 cm hiatal hernia was present.      The exam of the stomach was otherwise normal.      The examined duodenum was normal. Impression:            - Benign-appearing esophageal stenosis. Dilated.                        - 3 cm hiatal hernia.                        - Normal examined duodenum.                        - No specimens collected. Recommendation:        - Continue present medications.                        - Monitor results to esophageal dilation                        - Proceed with colonoscopy Procedure Code(s):     --- Professional ---                        [phone], Esophagogastroduodenoscopy, flexible,                         transoral; diagnostic, including collection of                         specimen(s) by brushing or washing, when performed                         (separate procedure)                        43450, Dilation of esophagus, by  unguided sound or                         bougie, single or multiple passes Diagnosis Code(s):     --- Professional ---                        K21.9, Gastro-esophageal reflux disease without                         esophagitis                        R13.10, Dysphagia, unspecified                        K44.9, Diaphragmatic hernia without obstruction or                         gangrene                        K22.2, Esophageal obstruction CPT copyright 2022 American Medical Association. All rights reserved. The codes documented in this report  are preliminary and upon coder review may  be revised to meet current compliance requirements. Stanton Kidney MD, MD 04/14/2023 11:03:32 AM This report has been signed electronically. Number of Addenda: 0 Note Initiated On: 04/14/2023 10:45 AM Estimated Blood Loss:  Estimated blood loss: none.      [facility]

## 2023-04-14 NOTE — Anesthesia Postprocedure Evaluation (Signed)
Anesthesia Post Note  Patient: Research scientist (physical sciences)  Procedure(s) Performed: COLONOSCOPY ESOPHAGOGASTRODUODENOSCOPY (EGD) WITH PROPOFOL  Patient location during evaluation: Endoscopy Anesthesia Type: General Level of consciousness: awake and alert Pain management: pain level controlled Vital Signs Assessment: post-procedure vital signs reviewed and stable Respiratory status: spontaneous breathing, nonlabored ventilation, respiratory function stable and patient connected to nasal cannula oxygen Cardiovascular status: blood pressure returned to baseline and stable Postop Assessment: no apparent nausea or vomiting Anesthetic complications: no   No notable events documented.   Last Vitals:  Vitals:   04/14/23 1128 04/14/23 1138  BP: (!) 86/34 (!) 99/54  Pulse: 73 66  Resp: 19 15  Temp:    SpO2: 99% 98%    Last Pain:  Vitals:   04/14/23 1138  TempSrc:   PainSc: 0-No pain                 Louie Boston

## 2023-04-14 NOTE — Interval H&P Note (Signed)
History and Physical Interval Note:  04/14/2023 10:46 AM  Raven Lambert  has presented today for surgery, with the diagnosis of V12.72 (ICD-9-CM) - Z86.010 (ICD-10-CM) - Hx of adenomatous colonic polyps 787.29 (ICD-9-CM) - R13.19 (ICD-10-CM) - Esophageal dysphagia 530.81 (ICD-9-CM) - K21.9 (ICD-10-CM) - Gastroesophageal reflux disease, unspecified.  The various methods of treatment have been discussed with the patient and family. After consideration of risks, benefits and other options for treatment, the patient has consented to  Procedure(s): COLONOSCOPY (N/A) ESOPHAGOGASTRODUODENOSCOPY (EGD) WITH PROPOFOL (N/A) as a surgical intervention.  The patient's history has been reviewed, patient examined, no change in status, stable for surgery.  I have reviewed the patient's chart and labs.  Questions were answered to the patient's satisfaction.     [place], [facility]

## 2023-04-14 NOTE — Op Note (Signed)
[facility] Patient Name: Raven Lambert Procedure Date: 04/14/2023 10:44 AM MRN: [id number] Account #: [id number] Date of Birth: 04/25/1956 Admit Type: Outpatient Age: 67 Room: [facility] ENDO ROOM 2 Gender: Female Note Status: Finalized Instrument Name: Nelda Marseille [id number] Procedure:             Colonoscopy Indications:           High risk colon cancer surveillance: Personal history                         of colonic polyps Providers:             Boykin Nearing. Norma Fredrickson MD, MD Referring MD:          Clydie Braun (Referring MD) Medicines:             Propofol per Anesthesia Complications:         No immediate complications. Procedure:             Pre-Anesthesia Assessment:                        - The risks and benefits of the procedure and the                         sedation options and risks were discussed with the                         patient. All questions were answered and informed                         consent was obtained.                        - The risks and benefits of the procedure and the                         sedation options and risks were discussed with the                         patient. All questions were answered and informed                         consent was obtained.                        - Patient identification and proposed procedure were                         verified prior to the procedure by the nurse. The                         procedure was verified in the procedure room.                        - ASA Grade Assessment: III - A patient with severe                         systemic disease.                        -  After reviewing the risks and benefits, the patient                         was deemed in satisfactory condition to undergo the                         procedure.                        After obtaining informed consent, the colonoscope was                         passed under direct vision. Throughout  the procedure,                         the patient's blood pressure, pulse, and oxygen                         saturations were monitored continuously. The                         Colonoscope was introduced through the anus and                         advanced to the the cecum, identified by appendiceal                         orifice and ileocecal valve. The colonoscopy was                         performed without difficulty. The patient tolerated                         the procedure well. The quality of the bowel                         preparation was good. The ileocecal valve, appendiceal                         orifice, and rectum were photographed. Findings:      The perianal and digital rectal examinations were normal. Pertinent       negatives include normal sphincter tone and no palpable rectal lesions.      Non-bleeding internal hemorrhoids were found during retroflexion. The       hemorrhoids were Grade I (internal hemorrhoids that do not prolapse).      Many medium-mouthed diverticula were found in the sigmoid colon.      The exam was otherwise without abnormality. Impression:            - Non-bleeding internal hemorrhoids.                        - Diverticulosis in the sigmoid colon.                        - The examination was otherwise normal.                        - No specimens collected. Recommendation:        - Patient has  a contact number available for                         emergencies. The signs and symptoms of potential                         delayed complications were discussed with the patient.                         Return to normal activities tomorrow. Written                         discharge instructions were provided to the patient.                        - Resume previous diet.                        - Continue present medications.                        - Repeat colonoscopy in 10 years for screening                         purposes.                         - Await pathology results from EGD, also performed                         today.                        - Monitor results to esophageal dilation Procedure Code(s):     --- Professional ---                        G0105, Colorectal cancer screening; colonoscopy on                         individual at high risk Diagnosis Code(s):     --- Professional ---                        K57.30, Diverticulosis of large intestine without                         perforation or abscess without bleeding                        K64.0, First degree hemorrhoids                        Z86.010, Personal history of colonic polyps CPT copyright 2022 American Medical Association. All rights reserved. The codes documented in this report are preliminary and upon coder review may  be revised to meet current compliance requirements. Stanton Kidney MD, MD 04/14/2023 11:23:16 AM This report has been signed electronically. Number of Addenda: 0 Note Initiated On: 04/14/2023 10:44 AM Scope Withdrawal Time: 0 hours 6 minutes 6 seconds  Total Procedure Duration: 0 hours 9 minutes 28 seconds  Estimated Blood Loss:  Estimated blood loss: none.  [facility]

## 2023-04-14 NOTE — Transfer of Care (Signed)
Immediate Anesthesia Transfer of Care Note  Patient: Raven Lambert  Procedure(s) Performed: COLONOSCOPY ESOPHAGOGASTRODUODENOSCOPY (EGD) WITH PROPOFOL  Patient Location: PACU and Endoscopy Unit  Anesthesia Type:General  Level of Consciousness: drowsy and patient cooperative  Airway & Oxygen Therapy: Patient Spontanous Breathing  Post-op Assessment: Report given to RN and Post -op Vital signs reviewed and stable  Post vital signs: Reviewed and stable  Last Vitals:  Vitals Value Taken Time  BP 94/41 04/14/23 1118  Temp 35.8 C 04/14/23 1118  Pulse 84 04/14/23 1118  Resp 21 04/14/23 1118  SpO2 98 % 04/14/23 1118    Last Pain:  Vitals:   04/14/23 1118  TempSrc: Temporal  PainSc: 0-No pain         Complications: No notable events documented.

## 2023-04-14 NOTE — H&P (Signed)
Outpatient short stay form Pre-procedure 04/14/2023 10:44 AM Raven Lambert K. Norma Fredrickson, M.D.  Primary Physician: Clydie Braun, M.D.  Reason for visit:  Dysphagia, GERD, personal history of colon polyps  History of present illness:  Patient with dysphagia x 6 months. GERD controlled on pantoprazole 40mg  daily. No abdominal pain.                           Patient presents for colonoscopy for a personal hx of colon polyps. The patient denies abdominal pain, abnormal weight loss or rectal bleeding.      Current Facility-Administered Medications:    0.9 %  sodium chloride infusion, , Intravenous, Continuous, Chelcee Korpi, Boykin Nearing, MD, Last Rate: 20 mL/hr at 04/14/23 1029, 1,000 mL at 04/14/23 1029  Medications Prior to Admission  Medication Sig Dispense Refill Last Dose   amLODipine (NORVASC) 5 MG tablet Take 5 mg by mouth daily.   04/13/2023   aspirin 81 MG EC tablet Take by mouth.   Past Week   atorvastatin (LIPITOR) 10 MG tablet Take 10 mg by mouth daily.   04/13/2023   Cyanocobalamin 1000 MCG TBCR Take by mouth.   Past Week   folic acid (FOLVITE) 1 MG tablet TAKE 1 TABLET(1 MG) BY MOUTH DAILY 30 tablet 5 Past Week   folic acid (FOLVITE) 1 MG tablet TAKE 1 TABLET(1 MG) BY MOUTH DAILY 30 tablet 5 Past Week   hydroxychloroquine (PLAQUENIL) 200 MG tablet Take 200 mg by mouth 2 (two) times daily.   04/13/2023   lisinopril (ZESTRIL) 40 MG tablet Take 40 mg by mouth daily.   04/14/2023 at 730   methotrexate (RHEUMATREX) 2.5 MG tablet Take 2.5 mg by mouth once a week. Caution:Chemotherapy. Protect from light.   Past Week   metoprolol succinate (TOPROL-XL) 50 MG 24 hr tablet Take 50 mg by mouth daily. Take with or immediately following a meal.   04/13/2023   montelukast (SINGULAIR) 10 MG tablet Take 10 mg by mouth at bedtime.   04/13/2023   Multiple Vitamin (MULTIVITAMIN) tablet Take 1 tablet by mouth daily.   Past Week   omeprazole (PRILOSEC) 20 MG capsule Take 20 mg by mouth daily.   04/13/2023      Allergies  Allergen Reactions   Percocet [Oxycodone-Acetaminophen] Itching     Past Medical History:  Diagnosis Date   AICD (automatic cardioverter/defibrillator) present 12/15/2017   [employer]   Anemia    Arthritis    Cancer (HCC) 1988   uterine   Chronic systolic HF (heart failure) (HCC)    Family history of adverse reaction to anesthesia    High cholesterol    pt states only while not fasting   History of colon polyps    Hypertension    LBBB (left bundle branch block)    Reactive airway disease, severe persistent, uncomplicated    Rotator cuff (capsule) sprain    Stress-induced cardiomyopathy    Tachycardia     Review of systems:  Otherwise negative.    Physical Exam  Gen: Alert, oriented. Appears stated age.  HEENT: [place]/AT. PERRLA. Lungs: CTA, no wheezes. CV: RR nl S1, S2. Abd: soft, benign, no masses. BS+ Ext: No edema. Pulses 2+    Planned procedures: Proceed with EGD and colonoscopy. The patient understands the nature of the planned procedure, indications, risks, alternatives and potential complications including but not limited to bleeding, infection, perforation, damage to internal organs and possible oversedation/side effects from anesthesia. The patient agrees and gives  consent to proceed.  Please refer to procedure notes for findings, recommendations and patient disposition/instructions.     Raven Lambert K. Norma Fredrickson, M.D. Gastroenterology 04/14/2023  10:44 AM

## 2023-04-15 ENCOUNTER — Encounter: Payer: Self-pay | Admitting: Internal Medicine

## 2023-09-03 ENCOUNTER — Inpatient Hospital Stay (HOSPITAL_BASED_OUTPATIENT_CLINIC_OR_DEPARTMENT_OTHER): Payer: 59 | Admitting: Oncology

## 2023-09-03 ENCOUNTER — Other Ambulatory Visit: Payer: Self-pay | Admitting: *Deleted

## 2023-09-03 ENCOUNTER — Encounter: Payer: Self-pay | Admitting: Oncology

## 2023-09-03 ENCOUNTER — Inpatient Hospital Stay: Payer: 59 | Attending: Oncology

## 2023-09-03 VITALS — BP 116/41 | HR 66 | Temp 97.2°F | Resp 19 | Ht 64.0 in | Wt 172.0 lb

## 2023-09-03 DIAGNOSIS — D696 Thrombocytopenia, unspecified: Secondary | ICD-10-CM

## 2023-09-03 DIAGNOSIS — M069 Rheumatoid arthritis, unspecified: Secondary | ICD-10-CM | POA: Diagnosis not present

## 2023-09-03 DIAGNOSIS — F17210 Nicotine dependence, cigarettes, uncomplicated: Secondary | ICD-10-CM | POA: Insufficient documentation

## 2023-09-03 DIAGNOSIS — D61818 Other pancytopenia: Secondary | ICD-10-CM | POA: Diagnosis not present

## 2023-09-03 LAB — CBC
HCT: 31 % — ABNORMAL LOW (ref 36.0–46.0)
Hemoglobin: 10.5 g/dL — ABNORMAL LOW (ref 12.0–15.0)
MCH: 34.7 pg — ABNORMAL HIGH (ref 26.0–34.0)
MCHC: 33.9 g/dL (ref 30.0–36.0)
MCV: 102.3 fL — ABNORMAL HIGH (ref 80.0–100.0)
Platelets: 104 10*3/uL — ABNORMAL LOW (ref 150–400)
RBC: 3.03 MIL/uL — ABNORMAL LOW (ref 3.87–5.11)
RDW: 13.7 % (ref 11.5–15.5)
WBC: 5.7 10*3/uL (ref 4.0–10.5)
nRBC: 0 % (ref 0.0–0.2)

## 2023-09-03 LAB — IRON AND TIBC
Iron: 80 ug/dL (ref 28–170)
Saturation Ratios: 30 % (ref 10.4–31.8)
TIBC: 270 ug/dL (ref 250–450)
UIBC: 190 ug/dL

## 2023-09-03 LAB — FERRITIN: Ferritin: 111 ng/mL (ref 11–307)

## 2023-09-04 NOTE — Progress Notes (Signed)
Hematology/Oncology Consult note [facility]  [phone] [phone]  Patient Care Team: Mick Sell, MD as PCP - General (Infectious Diseases) Creig Hines, MD as Consulting Physician (Oncology)   Name of the patient: Raven Lambert  [id number]  04-22-1956   Date of visit: 09/04/23  Diagnosis- pancytopenia possibly secondary to underlying rheumatoid with arthritis versus MDS     Chief complaint/ Reason for visit-follow-up of pancytopenia  Heme/Onc history:  patient is a 67 year old female with a past medical history significant for  CHF s/p AICD placement, hypertension hyperlipidemia among other medical problems.  She has been referred to [place] for anemia and thrombocytopenia.  Looking back at her prior CBCs patient's hemoglobin has been mostly been between 10-11 since March 2020.  Prior to that her hemoglobin was consistently more than 12.  Over the last 1 year her hemoglobin has drifted down from 11.7-10.9.  Gradual onset macrocytosis.  Platelet count presently is 109 and has been gradually drifting down since 2019 when it was 138.     Results of blood work from 02/11/2021 were as follows: CBC showed white count of 5.3, H&H of 11.5/32.4 with an MCV of 98.8 and a platelet count of 124.  Folate levels were low at 5.  CMP was unremarkable.  Myeloma panel showed no M protein.  Haptoglobin, TSH, reticulocyte count normal.  Iron studies and ferritin normal.    Interval history-she is doing well overall.  No recurrent infections or hospitalizations.  Joint pain is overall stable  ECOG PS- 1 Pain scale- 0   Review of systems- Review of Systems  Constitutional:  Negative for chills, fever, malaise/fatigue and weight loss.  HENT:  Negative for congestion, ear discharge and nosebleeds.   Eyes:  Negative for blurred vision.  Respiratory:  Negative for cough, hemoptysis, sputum production, shortness of breath and wheezing.    Cardiovascular:  Negative for chest pain, palpitations, orthopnea and claudication.  Gastrointestinal:  Negative for abdominal pain, blood in stool, constipation, diarrhea, heartburn, melena, nausea and vomiting.  Genitourinary:  Negative for dysuria, flank pain, frequency, hematuria and urgency.  Musculoskeletal:  Negative for back pain, joint pain and myalgias.  Skin:  Negative for rash.  Neurological:  Negative for dizziness, tingling, focal weakness, seizures, weakness and headaches.  Endo/Heme/Allergies:  Does not bruise/bleed easily.  Psychiatric/Behavioral:  Negative for depression and suicidal ideas. The patient does not have insomnia.       Allergies  Allergen Reactions   Percocet [Oxycodone-Acetaminophen] Itching     Past Medical History:  Diagnosis Date   AICD (automatic cardioverter/defibrillator) present 12/15/2017   [employer]tr[employer]   Anemia    Arthritis    Cancer (HCC) 1988   uterine   Chronic systolic HF (heart failure) (HCC)    Family history of adverse reaction to anesthesia    High cholesterol    pt states only while not fasting   History of colon polyps    Hypertension    LBBB (left bundle branch block)    Reactive airway disease, severe persistent, uncomplicated    Rotator cuff (capsule) sprain    Stress-induced cardiomyopathy    Tachycardia      Past Surgical History:  Procedure Laterality Date   ABDOMINAL HYSTERECTOMY     CARDIAC DEFIBRILLATOR PLACEMENT  12/15/2017   at [facility]   CHOLECYSTECTOMY     COLONOSCOPY  [id number]   COLONOSCOPY N/A 04/14/2023   Procedure: COLONOSCOPY;  Surgeon: Toledo, Boykin Nearing, MD;  Location: [facility] ENDOSCOPY;  Service: Gastroenterology;  Laterality: N/A;   COLONOSCOPY WITH PROPOFOL N/A 06/27/2019   Procedure: COLONOSCOPY WITH PROPOFOL;  Surgeon: Toledo, Boykin Nearing, MD;  Location: [facility] ENDOSCOPY;  Service: Gastroenterology;  Laterality: N/A;   ESOPHAGEAL DILATION  04/14/2023   Procedure: ESOPHAGEAL DILATION;  Surgeon: Norma Fredrickson,  Boykin Nearing, MD;  Location: [facility];  Service: Gastroenterology;;   ESOPHAGOGASTRODUODENOSCOPY (EGD) WITH PROPOFOL N/A 04/14/2023   Procedure: ESOPHAGOGASTRODUODENOSCOPY (EGD) WITH PROPOFOL;  Surgeon: Toledo, Boykin Nearing, MD;  Location: [facility] ENDOSCOPY;  Service: Gastroenterology;  Laterality: N/A;   OOPHORECTOMY     OOPHORECTOMY     pt had one side done 2001 and the other side 2005   SHOULDER ARTHROSCOPY WITH OPEN ROTATOR CUFF REPAIR Right 06/28/2015   Procedure: SHOULDER ARTHROSCOPY WITH  OPEN ROTATOR CUFF REPAIR, Biceps tendon release and subacromial decompression.;  Surgeon: Erin Sons, MD;  Location: [facility];  Service: Orthopedics;  Laterality: Right;    Social History   Socioeconomic History   Marital status: Married    Spouse name: Not on file   Number of children: Not on file   Years of education: Not on file   Highest education level: Not on file  Occupational History   Not on file  Tobacco Use   Smoking status: Every Day    Current packs/day: 0.25    Types: Cigarettes   Smokeless tobacco: Never  Vaping Use   Vaping status: Never Used  Substance and Sexual Activity   Alcohol use: No   Drug use: No   Sexual activity: Not on file  Other Topics Concern   Not on file  Social History Narrative   Not on file   Social Determinants of Health   Financial Resource Strain: Not on file  Food Insecurity: Not on file  Transportation Needs: Not on file  Physical Activity: Not on file  Stress: Not on file  Social Connections: Not on file  Intimate Partner Violence: Not on file    Family History  Problem Relation Age of Onset   Heart disease Mother    Diabetes type II Mother    Hypertension Mother    Heart disease Father    Hypertension Father    Breast cancer Neg Hx      Current Outpatient Medications:    amLODipine (NORVASC) 5 MG tablet, Take 5 mg by mouth daily., Disp: , Rfl:    aspirin 81 MG EC tablet, Take by mouth., Disp: , Rfl:     atorvastatin (LIPITOR) 10 MG tablet, Take 10 mg by mouth daily., Disp: , Rfl:    Cyanocobalamin 1000 MCG TBCR, Take by mouth., Disp: , Rfl:    folic acid (FOLVITE) 1 MG tablet, TAKE 1 TABLET(1 MG) BY MOUTH DAILY, Disp: 30 tablet, Rfl: 5   folic acid (FOLVITE) 1 MG tablet, TAKE 1 TABLET(1 MG) BY MOUTH DAILY, Disp: 30 tablet, Rfl: 5   hydroxychloroquine (PLAQUENIL) 200 MG tablet, Take 200 mg by mouth 2 (two) times daily., Disp: , Rfl:    lisinopril (ZESTRIL) 40 MG tablet, Take 40 mg by mouth daily., Disp: , Rfl:    methotrexate (RHEUMATREX) 2.5 MG tablet, Take 2.5 mg by mouth once a week. Caution:Chemotherapy. Protect from light., Disp: , Rfl:    metoprolol succinate (TOPROL-XL) 50 MG 24 hr tablet, Take 50 mg by mouth daily. Take with or immediately following a meal., Disp: , Rfl:    montelukast (SINGULAIR) 10 MG tablet, Take 10 mg by mouth at bedtime., Disp: , Rfl:    Multiple  Vitamin (MULTIVITAMIN) tablet, Take 1 tablet by mouth daily., Disp: , Rfl:    omeprazole (PRILOSEC) 20 MG capsule, Take 20 mg by mouth daily., Disp: , Rfl:   Physical exam:  Vitals:   09/03/23 1142  BP: (!) 116/41  Pulse: 66  Resp: 19  Temp: (!) 97.2 F (36.2 C)  TempSrc: Tympanic  SpO2: 100%  Weight: 172 lb (78 kg)  Height: 5\' 4"  (1.626 m)   Physical Exam Cardiovascular:     Rate and Rhythm: Normal rate and regular rhythm.     Heart sounds: Normal heart sounds.  Pulmonary:     Effort: Pulmonary effort is normal.     Breath sounds: Normal breath sounds.  Skin:    General: Skin is warm and dry.  Neurological:     Mental Status: She is alert and oriented to person, place, and time.         Latest Ref Rng & Units 02/11/2021   11:52 AM  CMP  Glucose 70 - 99 mg/dL 712   BUN 8 - 23 mg/dL 8   Creatinine 4.58 - 0.99 mg/dL 8.33   Sodium 825 - 053 mmol/L 137   Potassium 3.5 - 5.1 mmol/L 4.5   Chloride 98 - 111 mmol/L 101   CO2 22 - 32 mmol/L 25   Calcium 8.9 - 10.3 mg/dL 9.6   Total Protein 6.5 - 8.1 g/dL  7.1   Total Bilirubin 0.3 - 1.2 mg/dL 0.6   Alkaline Phos 38 - 126 U/L 91   AST 15 - 41 U/L 17   ALT 0 - 44 U/L 17       Latest Ref Rng & Units 09/03/2023   11:19 AM  CBC  WBC 4.0 - 10.5 K/uL 5.7   Hemoglobin 12.0 - 15.0 g/dL 97.6   Hematocrit 73.4 - 46.0 % 31.0   Platelets 150 - 400 K/uL 104     No images are attached to the encounter.  No results found.   Assessment and plan- Patient is a 67 y.o. female here for routine follow-up of pancytopenia  White count is presently normal.  Her hemoglobin has remained stable between 10-11 for the last 5 years.  She also has chronic thrombocytopenia with a platelet count fluctuates between 90-1 30.  Given the stability of her counts she does not require any bone marrow biopsy at this time.  Pancytopenia can be in the setting of rheumatoid arthritis as well.  In 1 year with labs   Visit Diagnosis 1. Thrombocytopenia (HCC)   2. Other pancytopenia (HCC)      Dr. Owens Shark, MD, MPH [facility] [id number] 09/04/2023 12:08 PM

## 2023-09-06 LAB — KAPPA/LAMBDA LIGHT CHAINS
Kappa free light chain: 20.1 mg/L — ABNORMAL HIGH (ref 3.3–19.4)
Kappa, lambda light chain ratio: 1.41 (ref 0.26–1.65)
Lambda free light chains: 14.3 mg/L (ref 5.7–26.3)

## 2024-03-07 ENCOUNTER — Other Ambulatory Visit: Payer: Self-pay | Admitting: Infectious Diseases

## 2024-03-07 DIAGNOSIS — Z1231 Encounter for screening mammogram for malignant neoplasm of breast: Secondary | ICD-10-CM

## 2024-03-09 ENCOUNTER — Ambulatory Visit
Admission: RE | Admit: 2024-03-09 | Discharge: 2024-03-09 | Disposition: A | Discharge status: Home / Self Care | Source: Ambulatory Visit | Attending: Infectious Diseases | Admitting: Infectious Diseases

## 2024-03-09 DIAGNOSIS — Z1231 Encounter for screening mammogram for malignant neoplasm of breast: Secondary | ICD-10-CM | POA: Diagnosis present

## 2024-09-07 ENCOUNTER — Other Ambulatory Visit: Payer: Self-pay | Admitting: *Deleted

## 2024-09-07 DIAGNOSIS — D696 Thrombocytopenia, unspecified: Secondary | ICD-10-CM

## 2024-09-08 ENCOUNTER — Other Ambulatory Visit: Payer: Self-pay

## 2024-09-08 ENCOUNTER — Inpatient Hospital Stay: Payer: 59 | Attending: Oncology | Admitting: Nurse Practitioner

## 2024-09-08 ENCOUNTER — Inpatient Hospital Stay: Payer: 59

## 2024-09-08 ENCOUNTER — Encounter: Payer: Self-pay | Admitting: Nurse Practitioner

## 2024-09-08 ENCOUNTER — Inpatient Hospital Stay

## 2024-09-08 VITALS — BP 109/56 | HR 75 | Temp 97.1°F | Resp 18 | Ht 64.0 in | Wt 163.4 lb

## 2024-09-08 DIAGNOSIS — Z803 Family history of malignant neoplasm of breast: Secondary | ICD-10-CM | POA: Diagnosis not present

## 2024-09-08 DIAGNOSIS — D696 Thrombocytopenia, unspecified: Secondary | ICD-10-CM | POA: Diagnosis not present

## 2024-09-08 DIAGNOSIS — D61818 Other pancytopenia: Secondary | ICD-10-CM | POA: Insufficient documentation

## 2024-09-08 DIAGNOSIS — M069 Rheumatoid arthritis, unspecified: Secondary | ICD-10-CM | POA: Diagnosis not present

## 2024-09-08 DIAGNOSIS — D649 Anemia, unspecified: Secondary | ICD-10-CM | POA: Insufficient documentation

## 2024-09-08 DIAGNOSIS — F17210 Nicotine dependence, cigarettes, uncomplicated: Secondary | ICD-10-CM | POA: Insufficient documentation

## 2024-09-08 LAB — CBC WITH DIFFERENTIAL (CANCER CENTER ONLY)
Abs Immature Granulocytes: 0.03 K/uL (ref 0.00–0.07)
Basophils Absolute: 0 K/uL (ref 0.0–0.1)
Basophils Relative: 0 %
Eosinophils Absolute: 0.3 K/uL (ref 0.0–0.5)
Eosinophils Relative: 4 %
HCT: 28.6 % — ABNORMAL LOW (ref 36.0–46.0)
Hemoglobin: 9.8 g/dL — ABNORMAL LOW (ref 12.0–15.0)
Immature Granulocytes: 0 %
Lymphocytes Relative: 24 %
Lymphs Abs: 1.8 K/uL (ref 0.7–4.0)
MCH: 34.6 pg — ABNORMAL HIGH (ref 26.0–34.0)
MCHC: 34.3 g/dL (ref 30.0–36.0)
MCV: 101.1 fL — ABNORMAL HIGH (ref 80.0–100.0)
Monocytes Absolute: 0.4 K/uL (ref 0.1–1.0)
Monocytes Relative: 6 %
Neutro Abs: 4.8 K/uL (ref 1.7–7.7)
Neutrophils Relative %: 66 %
Platelet Count: 101 K/uL — ABNORMAL LOW (ref 150–400)
RBC: 2.83 MIL/uL — ABNORMAL LOW (ref 3.87–5.11)
RDW: 14.5 % (ref 11.5–15.5)
WBC Count: 7.4 K/uL (ref 4.0–10.5)
nRBC: 0 % (ref 0.0–0.2)

## 2024-09-08 LAB — IRON AND TIBC
Iron: 62 ug/dL (ref 28–170)
Saturation Ratios: 23 % (ref 10.4–31.8)
TIBC: 266 ug/dL (ref 250–450)
UIBC: 204 ug/dL

## 2024-09-08 LAB — FERRITIN: Ferritin: 248 ng/mL (ref 11–307)

## 2024-09-08 LAB — FOLATE: Folate: >20 ng/mL (ref >5.9)

## 2024-09-08 LAB — TSH: TSH: 0.731 u[IU]/mL (ref 0.350–4.500)

## 2024-09-08 LAB — VITAMIN B12: Vitamin B-12: 186 pg/mL (ref 180–914)

## 2024-09-08 NOTE — Progress Notes (Signed)
 Hematology/Oncology Consult note [facility]  Telephone:([phone] [phone]  Patient Care Team: Epifanio Alm SQUIBB, MD as PCP - General (Infectious Diseases) Melanee Annah BROCKS, MD as Consulting Physician (Oncology)   Name of the patient: Raven Lambert  [id number]  April 05, 1956   Date of visit: 09/08/24  Diagnosis- pancytopenia possibly secondary to underlying rheumatoid with arthritis versus MDS     Chief complaint/ Reason for visit-follow-up of pancytopenia  Heme/Onc history:  patient is a 68 year old female with a past medical history significant for  CHF s/p AICD placement, hypertension hyperlipidemia among other medical problems.  She has been referred to us  for anemia and thrombocytopenia.  Looking back at her prior CBCs patient's hemoglobin has been mostly been between 10-11 since March 2020.  Prior to that her hemoglobin was consistently more than 12.  Over the last 1 year her hemoglobin has drifted down from 11.7-10.9.  Today Hg has decreased to 9.8. Gradual onset macrocytosis.  Platelet count presently is 101 and has been gradually drifting down since 2019 when it was 138.     Results of blood work from 02/11/2021 were as follows: CBC showed white count of 5.3, H&H of 11.5/32.4 with an MCV of 98.8 and a platelet count of 124.  Folate levels were low at 5.  CMP was unremarkable.  Myeloma panel showed no M protein.  Haptoglobin, TSH, reticulocyte count normal.  Iron studies and ferritin normal.    Interval history-she is doing well overall.  No recurrent infections or hospitalizations.  Joint pain is overall stable.  Patient denies any increased fatigue or dizziness.  She denies seeing any blood in urine or stool sputum or stool.  Denies any dark tarry stools.  ECOG PS- 1 Pain scale- 0   Review of systems- Review of Systems  Constitutional:  Negative for chills, fever, malaise/fatigue and weight loss.  HENT:  Negative for congestion, ear  discharge and nosebleeds.   Eyes:  Negative for blurred vision.  Respiratory:  Negative for cough, hemoptysis, sputum production, shortness of breath and wheezing.   Cardiovascular:  Negative for chest pain, palpitations, orthopnea and claudication.  Gastrointestinal:  Negative for abdominal pain, blood in stool, constipation, diarrhea, heartburn, melena, nausea and vomiting.  Genitourinary:  Negative for dysuria, flank pain, frequency, hematuria and urgency.  Musculoskeletal:  Negative for back pain, joint pain and myalgias.  Skin:  Negative for rash.  Neurological:  Negative for dizziness, tingling, focal weakness, seizures, weakness and headaches.  Endo/Heme/Allergies:  Does not bruise/bleed easily.  Psychiatric/Behavioral:  Negative for depression and suicidal ideas. The patient does not have insomnia.       Allergies  Allergen Reactions   Percocet [Oxycodone-Acetaminophen ] Itching     Past Medical History:  Diagnosis Date   AICD (automatic cardioverter/defibrillator) present 12/15/2017   [employer]   Anemia    Arthritis    Cancer (HCC) 1988   uterine   Chronic systolic HF (heart failure) (HCC)    Family history of adverse reaction to anesthesia    High cholesterol    pt states only while not fasting   History of colon polyps    Hypertension    LBBB (left bundle branch block)    Reactive airway disease, severe persistent, uncomplicated (HCC)    Rotator cuff (capsule) sprain    Stress-induced cardiomyopathy    Tachycardia      Past Surgical History:  Procedure Laterality Date   ABDOMINAL HYSTERECTOMY     CARDIAC DEFIBRILLATOR PLACEMENT  12/15/2017   at [facility]   CHOLECYSTECTOMY     COLONOSCOPY  [phone]   COLONOSCOPY N/A 04/14/2023   Procedure: COLONOSCOPY;  Surgeon: Toledo, Ladell POUR, MD;  Location: [facility] END[facility];  Service: Gastroenterology;  Laterality: N/A;   COLONOSCOPY WITH PROPOFOL  N/A 06/27/2019   Procedure: COLONOSCOPY WITH PROPOFOL ;  Surgeon: Toledo, Ladell POUR, MD;  Location: [facility] END[facility];  Service: Gastroenterology;  Laterality: N/A;   ESOPHAGEAL DILATION  04/14/2023   Procedure: ESOPHAGEAL DILATION;  Surgeon: Aundria, Teodoro K, MD;  Location: [facility];  Service: Gastroenterology;;   ESOPHAGOGASTRODUODENOSCOPY (EGD) WITH PROPOFOL  N/A 04/14/2023   Procedure: ESOPHAGOGASTRODUODENOSCOPY (EGD) WITH PROPOFOL ;  Surgeon: Toledo, Ladell POUR, MD;  Location: [facility] END[facility];  Service: Gastroenterology;  Laterality: N/A;   OOPHORECTOMY     OOPHORECTOMY     pt had one side done 2001 and the other side 2005   SHOULDER ARTHROSCOPY WITH OPEN ROTATOR CUFF REPAIR Right 06/28/2015   Procedure: SHOULDER ARTHROSCOPY WITH  OPEN ROTATOR CUFF REPAIR, Biceps tendon release and subacromial decompression.;  Surgeon: Helayne Glenn, MD;  Location: [facility];  Service: Orthopedics;  Laterality: Right;    Social History   Socioeconomic History   Marital status: Married    Spouse name: Not on file   Number of children: Not on file   Years of education: Not on file   Highest education level: Not on file  Occupational History   Not on file  Tobacco Use   Smoking status: Every Day    Current packs/day: 0.25    Types: Cigarettes   Smokeless tobacco: Never  Vaping Use   Vaping status: Never Used  Substance and Sexual Activity   Alcohol use: No   Drug use: No   Sexual activity: Not on file  Other Topics Concern   Not on file  Social History Narrative   Not on file   Social Drivers of Health   Financial Resource Strain: Low Risk  (12/01/2023)   Received from [facility]   Overall Financial Resource Strain (CARDIA)    Difficulty of Paying Living Expenses: Not hard at all  Food Insecurity: No Food Insecurity (12/01/2023)   Received from [facility]   Hunger Vital Sign    Within the past 12 months, you worried that your food would run out before you got the money to buy more.: Never true    Within the past 12 months,  the food you bought just didn't last and you didn't have money to get more.: Never true  Transportation Needs: No Transportation Needs (12/01/2023)   Received from [facility] - Transportation    In the past 12 months, has lack of transportation kept you from medical appointments or from getting medications?: No    Lack of Transportation (Non-Medical): No  Physical Activity: Not on file  Stress: Not on file  Social Connections: Not on file  Intimate Partner Violence: Not on file    Family History  Problem Relation Age of Onset   Heart disease Mother    Diabetes type II Mother    Hypertension Mother    Heart disease Father    Hypertension Father    Breast cancer Daughter      Current Outpatient Medications:    amLODipine (NORVASC) 5 MG tablet, Take 5 mg by mouth daily., Disp: , Rfl:    aspirin 81 MG EC tablet, Take by mouth., Disp: , Rfl:    atorvastatin (LIPITOR) 10 MG tablet,  Take 10 mg by mouth daily., Disp: , Rfl:    Cyanocobalamin 1000 MCG TBCR, Take by mouth., Disp: , Rfl:    folic acid  (FOLVITE ) 1 MG tablet, TAKE 1 TABLET(1 MG) BY MOUTH DAILY, Disp: 30 tablet, Rfl: 5   furosemide (LASIX) 20 MG tablet, Take 20 mg by mouth daily., Disp: , Rfl:    hydroxychloroquine (PLAQUENIL) 200 MG tablet, Take 200 mg by mouth 2 (two) times daily., Disp: , Rfl:    lisinopril (ZESTRIL) 40 MG tablet, Take 40 mg by mouth daily., Disp: , Rfl:    methotrexate (RHEUMATREX) 2.5 MG tablet, Take 10 mg by mouth once a week., Disp: , Rfl:    metoprolol succinate (TOPROL-XL) 50 MG 24 hr tablet, Take 50 mg by mouth daily. Take with or immediately following a meal., Disp: , Rfl:    montelukast (SINGULAIR) 10 MG tablet, Take 10 mg by mouth at bedtime., Disp: , Rfl:    Multiple Vitamin (MULTIVITAMIN) tablet, Take 1 tablet by mouth daily., Disp: , Rfl:    pantoprazole  (PROTONIX ) 20 MG tablet, Take 20 mg by mouth daily., Disp: , Rfl:   Physical exam:  Vitals:   09/08/24 1100   BP: (!) 109/56  Pulse: 75  Resp: 18  Temp: (!) 97.1 F (36.2 C)  TempSrc: Tympanic  Weight: 163 lb 6.4 oz (74.1 kg)  Height: 5' 4 (1.626 m)   Physical Exam Cardiovascular:     Rate and Rhythm: Normal rate and regular rhythm.     Heart sounds: Normal heart sounds.  Pulmonary:     Effort: Pulmonary effort is normal.     Breath sounds: Normal breath sounds.  Skin:    General: Skin is warm and dry.  Neurological:     Mental Status: She is alert and oriented to person, place, and time.         Latest Ref Rng & Units 02/11/2021   11:52 AM  CMP  Glucose 70 - 99 mg/dL 885   BUN 8 - 23 mg/dL 8   Creatinine 9.55 - 8.99 mg/dL 9.20   Sodium 864 - 854 mmol/L 137   Potassium 3.5 - 5.1 mmol/L 4.5   Chloride 98 - 111 mmol/L 101   CO2 22 - 32 mmol/L 25   Calcium 8.9 - 10.3 mg/dL 9.6   Total Protein 6.5 - 8.1 g/dL 7.1   Total Bilirubin 0.3 - 1.2 mg/dL 0.6   Alkaline Phos 38 - 126 U/L 91   AST 15 - 41 U/L 17   ALT 0 - 44 U/L 17       Latest Ref Rng & Units 09/08/2024   10:44 AM  CBC  WBC 4.0 - 10.5 K/uL 7.4   Hemoglobin 12.0 - 15.0 g/dL 9.8   Hematocrit 63.9 - 46.0 % 28.6   Platelets 150 - 400 K/uL 101     No images are attached to the encounter.  No results found.   Assessment and plan- Patient is a 68 y.o. female here for routine follow-up of pancytopenia  White count is presently normal.  Her hemoglobin has trended down to 9.8 and she is macrocyctic today.  She also has chronic thrombocytopenia with a platelet count fluctuates between 90-1 30.  Plt count stable at 101 today.  Given the stability of her counts she does not require any bone marrow biopsy at this time.  Pancytopenia can be in the setting of rheumatoid arthritis as well.  Due to a slight trend down in hemoglobin.  I added on TSH, vitamin B12, folate, ferritin, iron and TIBC today.  She is to follow back up in 3 months with repeat CBC and see MD.   Visit Diagnosis 1. Anemia, unspecified type     Plan: Add on ferritin/Iron and TIBC, Folate, Vitamin B12, TSH today F/U in 3 mths cbc with Diff see MD LP   Morna Husband AGNP-C [facility] 09/08/2024 2:24 PM

## 2024-12-08 ENCOUNTER — Inpatient Hospital Stay: Admitting: Oncology

## 2024-12-08 ENCOUNTER — Inpatient Hospital Stay
